# Patient Record
Sex: Female | Born: 1955 | Race: White | Hispanic: No | Marital: Married | Smoking: Former smoker
Health system: Southern US, Community
[De-identification: ages and names within clinical notes are randomized; demographics above are authoritative.]

## PROBLEM LIST (undated history)

## (undated) DIAGNOSIS — D649 Anemia, unspecified: Secondary | ICD-10-CM

## (undated) DIAGNOSIS — I10 Essential (primary) hypertension: Secondary | ICD-10-CM

## (undated) DIAGNOSIS — R42 Dizziness and giddiness: Secondary | ICD-10-CM

## (undated) DIAGNOSIS — I499 Cardiac arrhythmia, unspecified: Secondary | ICD-10-CM

## (undated) DIAGNOSIS — G4733 Obstructive sleep apnea (adult) (pediatric): Secondary | ICD-10-CM

## (undated) DIAGNOSIS — E039 Hypothyroidism, unspecified: Secondary | ICD-10-CM

## (undated) DIAGNOSIS — K5790 Diverticulosis of intestine, part unspecified, without perforation or abscess without bleeding: Secondary | ICD-10-CM

## (undated) DIAGNOSIS — F419 Anxiety disorder, unspecified: Secondary | ICD-10-CM

## (undated) DIAGNOSIS — M1990 Unspecified osteoarthritis, unspecified site: Secondary | ICD-10-CM

## (undated) DIAGNOSIS — E669 Obesity, unspecified: Secondary | ICD-10-CM

## (undated) DIAGNOSIS — E785 Hyperlipidemia, unspecified: Secondary | ICD-10-CM

## (undated) DIAGNOSIS — K219 Gastro-esophageal reflux disease without esophagitis: Secondary | ICD-10-CM

## (undated) DIAGNOSIS — K635 Polyp of colon: Secondary | ICD-10-CM

## (undated) HISTORY — DX: Hyperlipidemia, unspecified: E78.5

## (undated) HISTORY — PX: OTHER SURGICAL HISTORY: SHX169

## (undated) HISTORY — DX: Polyp of colon: K63.5

## (undated) HISTORY — DX: Dizziness and giddiness: R42

## (undated) HISTORY — DX: Obesity, unspecified: E66.9

## (undated) HISTORY — DX: Cardiac arrhythmia, unspecified: I49.9

## (undated) HISTORY — DX: Diverticulosis of intestine, part unspecified, without perforation or abscess without bleeding: K57.90

## (undated) HISTORY — DX: Obstructive sleep apnea (adult) (pediatric): G47.33

## (undated) HISTORY — DX: Essential (primary) hypertension: I10

---

## 1992-11-03 HISTORY — PX: ABDOMINAL HYSTERECTOMY: SHX81

## 1999-06-11 ENCOUNTER — Other Ambulatory Visit: Admission: RE | Admit: 1999-06-11 | Discharge: 1999-06-11 | Payer: Self-pay | Admitting: Obstetrics and Gynecology

## 1999-12-22 ENCOUNTER — Encounter: Payer: Self-pay | Admitting: Obstetrics and Gynecology

## 1999-12-22 ENCOUNTER — Encounter: Admission: RE | Admit: 1999-12-22 | Discharge: 1999-12-22 | Payer: Self-pay | Admitting: Obstetrics and Gynecology

## 2000-09-18 ENCOUNTER — Other Ambulatory Visit: Admission: RE | Admit: 2000-09-18 | Discharge: 2000-09-18 | Payer: Self-pay | Admitting: Obstetrics and Gynecology

## 2000-12-25 ENCOUNTER — Encounter: Admission: RE | Admit: 2000-12-25 | Discharge: 2000-12-25 | Payer: Self-pay | Admitting: Family Medicine

## 2000-12-25 ENCOUNTER — Encounter: Payer: Self-pay | Admitting: Family Medicine

## 2001-09-23 ENCOUNTER — Other Ambulatory Visit: Admission: RE | Admit: 2001-09-23 | Discharge: 2001-09-23 | Payer: Self-pay | Admitting: Obstetrics and Gynecology

## 2002-01-07 ENCOUNTER — Encounter: Admission: RE | Admit: 2002-01-07 | Discharge: 2002-01-07 | Payer: Self-pay | Admitting: Obstetrics and Gynecology

## 2002-01-07 ENCOUNTER — Encounter: Payer: Self-pay | Admitting: Obstetrics and Gynecology

## 2003-01-12 ENCOUNTER — Encounter: Payer: Self-pay | Admitting: Obstetrics and Gynecology

## 2003-01-12 ENCOUNTER — Encounter: Admission: RE | Admit: 2003-01-12 | Discharge: 2003-01-12 | Payer: Self-pay | Admitting: Obstetrics and Gynecology

## 2003-03-03 ENCOUNTER — Ambulatory Visit: Admission: RE | Admit: 2003-03-03 | Discharge: 2003-03-03 | Payer: Self-pay

## 2004-01-25 ENCOUNTER — Encounter: Admission: RE | Admit: 2004-01-25 | Discharge: 2004-01-25 | Payer: Self-pay | Admitting: Family Medicine

## 2004-09-26 ENCOUNTER — Ambulatory Visit (HOSPITAL_COMMUNITY): Admission: RE | Admit: 2004-09-26 | Discharge: 2004-09-26 | Payer: Self-pay | Admitting: Gastroenterology

## 2005-02-14 ENCOUNTER — Encounter: Admission: RE | Admit: 2005-02-14 | Discharge: 2005-02-14 | Payer: Self-pay | Admitting: Family Medicine

## 2006-02-15 ENCOUNTER — Encounter: Admission: RE | Admit: 2006-02-15 | Discharge: 2006-02-15 | Payer: Self-pay | Admitting: Internal Medicine

## 2006-04-11 ENCOUNTER — Observation Stay (HOSPITAL_COMMUNITY): Admission: EM | Admit: 2006-04-11 | Discharge: 2006-04-13 | Payer: Self-pay | Admitting: Emergency Medicine

## 2006-04-13 ENCOUNTER — Encounter (INDEPENDENT_AMBULATORY_CARE_PROVIDER_SITE_OTHER): Payer: Self-pay | Admitting: Specialist

## 2007-02-18 ENCOUNTER — Encounter: Admission: RE | Admit: 2007-02-18 | Discharge: 2007-02-18 | Payer: Self-pay | Admitting: Obstetrics and Gynecology

## 2007-12-09 ENCOUNTER — Encounter: Payer: Self-pay | Admitting: Pulmonary Disease

## 2008-02-20 ENCOUNTER — Encounter: Admission: RE | Admit: 2008-02-20 | Discharge: 2008-02-20 | Payer: Self-pay | Admitting: Internal Medicine

## 2009-02-26 ENCOUNTER — Encounter: Admission: RE | Admit: 2009-02-26 | Discharge: 2009-02-26 | Payer: Self-pay | Admitting: Internal Medicine

## 2009-06-06 ENCOUNTER — Encounter: Payer: Self-pay | Admitting: Pulmonary Disease

## 2009-06-14 ENCOUNTER — Encounter (INDEPENDENT_AMBULATORY_CARE_PROVIDER_SITE_OTHER): Payer: Self-pay | Admitting: *Deleted

## 2009-06-15 ENCOUNTER — Ambulatory Visit: Payer: Self-pay | Admitting: Pulmonary Disease

## 2009-06-15 DIAGNOSIS — E785 Hyperlipidemia, unspecified: Secondary | ICD-10-CM | POA: Insufficient documentation

## 2009-06-15 DIAGNOSIS — I10 Essential (primary) hypertension: Secondary | ICD-10-CM | POA: Insufficient documentation

## 2009-06-15 DIAGNOSIS — G4730 Sleep apnea, unspecified: Secondary | ICD-10-CM | POA: Insufficient documentation

## 2009-08-29 ENCOUNTER — Encounter: Payer: Self-pay | Admitting: Pulmonary Disease

## 2009-10-11 ENCOUNTER — Ambulatory Visit: Payer: Self-pay | Admitting: Pulmonary Disease

## 2009-10-26 ENCOUNTER — Telehealth: Payer: Self-pay | Admitting: Pulmonary Disease

## 2010-02-28 ENCOUNTER — Encounter: Admission: RE | Admit: 2010-02-28 | Discharge: 2010-02-28 | Payer: Self-pay | Admitting: Internal Medicine

## 2010-06-21 ENCOUNTER — Encounter: Payer: Self-pay | Admitting: Pulmonary Disease

## 2010-06-21 ENCOUNTER — Telehealth (INDEPENDENT_AMBULATORY_CARE_PROVIDER_SITE_OTHER): Payer: Self-pay | Admitting: *Deleted

## 2010-09-19 ENCOUNTER — Ambulatory Visit: Payer: Self-pay | Admitting: Pulmonary Disease

## 2010-09-20 DIAGNOSIS — J019 Acute sinusitis, unspecified: Secondary | ICD-10-CM | POA: Insufficient documentation

## 2010-12-06 NOTE — Assessment & Plan Note (Signed)
Summary: rov/ mbw   Visit Type:  Follow-up Copy to:  dr Felipa Eth Primary Provider/Referring Provider:  dr. Felipa Eth  CC:  Sara Evans here for yearly follow up. Sara Evans states is sleeping better w/o CPAP and wants to discuss coming off since weight loss.  History of Present Illness: 55/F, referred for management of obstructive sleep apnea. Overnight PSG in 11/08 showing an AHI of 35/h (Dr Dohmeier) Her BMI then was 47, wt 264, Epworth Sleepiness Score 12. CPAP titration performed in 2/09, required 10 cm , nasal mask, good control of events in REM & supine sleep. Download from 2/1 to 06/06/09 >> residual AHI 14.7, predom hypopneas, good compliance , no leak. download 8/25- 08/29/10 >> good compliance, residual Ahi 10, occ leak.  September 19, 2010 9:19 AM  struggling with CPAP, wondering if she can come off  aug '11 >>Sara Evans has lost 30 lbs since April 1.  feels like cpap pressure is too high, wake up and ,mouth is too dry.  Has to take it off in the middle of the night. downlaod reviewed 5/18- 06/21/10 on 12 cm >> good usage, leak ++, residual AHI 14.6/h -Dryness may be due to leak or mouth breathing  Also c/o cough- yellow phlegm followig URI & nasal discharge    Preventive Screening-Counseling & Management  Alcohol-Tobacco     Smoking Status: quit     Packs/Day: 0.5     Year Started: 1973     Year Quit: 1977  Current Medications (verified): 1)  Toprol Xl 100 Mg Xr24h-Tab (Metoprolol Succinate) .... Take 1 Tablet By Mouth Once A Day 2)  Lipitor 10 Mg Tabs (Atorvastatin Calcium) .... Take 1 Tablet By Mouth Once A Day 3)  Ranitidine Hcl 150 Mg Tabs (Ranitidine Hcl) .... Take 1 Tablet By Mouth Two Times A Day 4)  Caltrate 600+d 600-400 Mg-Unit Tabs (Calcium Carbonate-Vitamin D) .... Take 1 Tablet By Mouth Two Times A Day 5)  Stool Softener 100 Mg Caps (Docusate Sodium) .... Take 1 Tab By Mouth At Bedtime 6)  Multivitamins   Tabs (Multiple Vitamin) .... Take 1 Tablet By Mouth Once A Day 7)  Aleve 220 Mg Tabs  (Naproxen Sodium) .... As Needed 8)  Xanax 0.5 Mg Tabs (Alprazolam) .... As Needed 9)  Fosamax 70 Mg Tabs (Alendronate Sodium) .... Take 1 Tablet By Mouth Once A Week 10)  Aspirin 81 Mg  Tabs (Aspirin) .... Take 1 Tablet By Mouth Once A Day 11)  Tribenzor 40-10-25 Mg Tabs (Olmesartan-Amlodipine-Hctz) .... Take 1/2 Tablet By Mouth Once A Day 12)  Lasix 20 Mg Tabs (Furosemide) .... As Needed 13)  Cpap .Marland Kitchen.. 12  Allergies (verified): 1)  ! Sulfa 2)  ! Maxzide 3)  ! Wellbutrin  Past History:  Past Medical History: Last updated: 06/15/2009 Hyperlipidemia Hypertension Sleep Apnea  Social History: Last updated: 06/15/2009 Patient states former smoker.  1977  Review of Systems       The patient complains of weight loss.  The patient denies anorexia, fever, weight gain, vision loss, decreased hearing, hoarseness, chest pain, syncope, dyspnea on exertion, peripheral edema, prolonged cough, headaches, hemoptysis, abdominal pain, melena, hematochezia, severe indigestion/heartburn, hematuria, muscle weakness, suspicious skin lesions, transient blindness, difficulty walking, depression, unusual weight change, abnormal bleeding, enlarged lymph nodes, and angioedema.    Vital Signs:  Patient profile:   55 year old female Height:      63 inches Weight:      227 pounds BMI:     40.36 O2 Sat:  100 % on Room air Temp:     98.0 degrees F oral Pulse rate:   63 / minute BP sitting:   142 / 94  (left arm) Cuff size:   large  Vitals Entered By: Zackery Barefoot CMA (September 19, 2010 9:00 AM)  O2 Flow:  Room air CC: Sara Evans here for yearly follow up. Sara Evans states is sleeping better w/o CPAP and wants to discuss coming off since weight loss Comments Medications reviewed with patient Verified contact number and pharmacy with patient Zackery Barefoot CMA  September 19, 2010 9:00 AM    Physical Exam  Additional Exam:  wt 227 September 20, 2010  Gen. Pleasant, well-nourished, in no distress, normal  affect ENT - no lesions, no post nasal drip, class 3 airway Neck: No JVD, no thyromegaly, no carotid bruits Lungs: no use of accessory muscles, no dullness to percussion, clear without rales or rhonchi  Cardiovascular: Rhythm regular, heart sounds  normal, no murmurs or gallops, no peripheral edema Musculoskeletal: No deformities, no cyanosis or clubbing      Impression & Recommendations:  Problem # 1:  SLEEP APNEA (ICD-780.57) Suggest home sleep study since she has lost significant (>10%) of body weight. She would like to wait some more time  as she is hopeful of losing more. She seems to be truly strugling with CPAP. I discussed oral appliance as an alternative - she would rather hold off for now. Orders: Est. Patient Level III (16109) Sleep Disorder Referral (Sleep Disorder)  Problem # 2:  SINUSITIS, ACUTE (ICD-461.9)  Her updated medication list for this problem includes:    Azithromycin 500 Mg Tabs (Azithromycin) ..... Once daily  Medications Added to Medication List This Visit: 1)  Tribenzor 40-10-25 Mg Tabs (Olmesartan-amlodipine-hctz) .... Take 1/2 tablet by mouth once a day 2)  Azithromycin 500 Mg Tabs (Azithromycin) .... Once daily  Patient Instructions: 1)  Copy sent to: [facility] 2)  Please schedule a follow-up appointment in 4 months. 3)  Home study  Prescriptions: AZITHROMYCIN 500 MG TABS (AZITHROMYCIN) once daily  #5 x 0   Entered and Authorized by:   Comer Locket Vassie Loll MD   Signed by:   Comer Locket Vassie Loll MD on 09/19/2010   Method used:   Electronically to        [facility] [address] [id number] ([facility]ail)       [address]       [place], [place]  [address]       Ph: [phone]       Fax: [phone]   RxID:   [id number]    Immunization History:  Influenza Immunization History:    Influenza:  historical (07/27/2010)

## 2010-12-06 NOTE — Progress Notes (Signed)
Summary: cpap problem  Phone Note Call from Patient Call back at Home Phone [phone] Call back at cell [phone]   Caller: Patient Call For: Sara Evans Reason for Call: Talk to Nurse, Talk to Doctor Summary of Call: pt has lost 30 lbs since April 1.  feels like cpap pressure is too high, wake up and ,mouth is too dry.  Has to take it off in the middle of the night. Please advise. Initial call taken by: Eugene Gavia,  June 21, 2010 11:09 AM  Follow-up for Phone Call        called and spoke with pt.  pt states she has lost 33 lbs since April and wonders if pressure may need to be adjusted on cpap.   Pt states she has noticed that she wakes up several times during the night with dry mouth dispite using humidifer on cpap machine.  Pt states she switched masks to nasal pillows and states she likes the nasal pillows more than the mask- more comfortable. but states nasal pillow do "make a lot of noise" unless pt wears it very tight which pt states is uncomfortable.  pt does c/o waking up with gas in the morning but denies headaches.  will forward message to RA to address. Arman Filter LPN  June 21, 2010 11:37 AM   Additional Follow-up for Phone Call Additional follow up Details #1::        obtain download on 12 cm  Additional Follow-up by: Comer Locket. Sara Loll MD,  June 21, 2010 2:17 PM    Additional Follow-up for Phone Call Additional follow up Details #2::    called and spoke with pt.  pt aware order sent to [facility] to get download off of cpap machine.  Aundra Millet Reynolds LPN  June 21, 2010 2:25 PM    Appended Document: cpap problem dwonlaod 5/18- 06/21/10 on 12 cm >> good usage, leak ++, resiaual AHI 14.6/h Let her know  -Dryness may be due to leak or mouth breathing - OV to discuss download pl  Appended Document: cpap problem LMOMTCB x1. Need to schedule pt with RA for follow up to discuss download. jwr  Appended Document: cpap problem jwr  Appended Document: cpap problem LMOMTCB x 2.  pt has appointment scheduled 07-29-10 @ 9:15am. Need to inform pt of dryness on 07-04-10 append. jwr  Appended Document: cpap problem pt advised.

## 2010-12-19 ENCOUNTER — Telehealth: Payer: Self-pay | Admitting: Pulmonary Disease

## 2011-01-16 ENCOUNTER — Other Ambulatory Visit: Payer: Self-pay | Admitting: Internal Medicine

## 2011-01-16 DIAGNOSIS — Z1231 Encounter for screening mammogram for malignant neoplasm of breast: Secondary | ICD-10-CM

## 2011-01-17 NOTE — Progress Notes (Signed)
Summary: nasal congestion/apnea link  Phone Note Call from Patient   Caller: Patient Call For: Dr. Vassie Loll Reason for Call: Referral Summary of Call: [phone] had contacted pt several times about arranging the apnea link for her. Each time she stated that she was having problems with allergies and stuffy nose.  Pt called today and stated that she had seen her primary doctor, Dr. Felipa Eth, and that he had place her on an allergy pill and a nasal spray. She states that this has helped some, however, she is still having some issues with a stuffy nose at night. She wanted to know if you wanted her to go ahead and do the apnea link or wait until March when hopefully she will have less problems? Please advise.  Initial call taken by: Alfonso Ramus,  December 19, 2010 9:58 AM  Follow-up for Phone Call        OK to wait until she is better. She can call us back when ready. Until then , I would suggest she stay on CPAP Patient phoned stated that she was returning a call to [place]. She can be reached at either [phone] cell [phone].Vedia Coffer  December 20, 2010 10:54 AM  Follow-up by: Comer Locket. Vassie Loll MD,  December 19, 2010 2:27 PM  Additional Follow-up for Phone Call Additional follow up Details #1::        LM for pt to return my call. Rhonda Cobb  December 19, 2010 4:35 PM [facility] for pt to return my call. Rhonda Cobb  December 20, 2010 3:20 PM Pt stated that she will call me back when she wants to schedule the apnea link. Rhonda Cobb  January 13, 2011 9:28 AM

## 2011-03-03 ENCOUNTER — Ambulatory Visit
Admission: RE | Admit: 2011-03-03 | Discharge: 2011-03-03 | Disposition: A | Discharge status: Home / Self Care | Payer: 59 | Source: Ambulatory Visit | Attending: Internal Medicine | Admitting: Internal Medicine

## 2011-03-03 DIAGNOSIS — Z1231 Encounter for screening mammogram for malignant neoplasm of breast: Secondary | ICD-10-CM

## 2011-03-24 NOTE — Op Note (Signed)
NAME:  Sara Evans, Sara Evans                ACCOUNT NO.:  [id number]   MEDICAL RECORD NO.:  [id number]          PATIENT TYPE:  AMB   LOCATION:  ENDO                         FACILITY:  [facility]   PHYSICIAN:  Danise Edge, M.D.   DATE OF BIRTH:  08-17-1956   DATE OF PROCEDURE:  09/26/2004  DATE OF DISCHARGE:                                 OPERATIVE REPORT   INDICATIONS:  Ms. Sara Evans is a 55 year old female born Nov 17, 1955.  [name] underwent a colonoscopy 10 years ago which was normal. She is  scheduled to undergo a screening colonoscopy with polypectomy to prevent  colon cancer.   ENDOSCOPIST:  Danise Edge, M.D.   PREMEDICATION:  Versed 6 mg, Demerol 60 mg.   PROCEDURE NOTE:  After obtaining informed consent, Ms. Sara Evans was placed in  the left lateral decubitus position. I administered intravenous Demerol and  intravenous Versed to achieve conscious sedation for the procedure. The  patient's blood pressure, oxygen saturation, and cardiac rhythm were  monitored throughout the procedure and documented in the medical record.   Anal inspection and digital rectal exam were normal. The Olympus adjustable  pediatric colonoscopy was introduced into the rectum and advanced to the  cecum. Colonic preparation for exam today was excellent.   Rectum:  Normal.   Sigmoid colon and descending colon:  Normal.   Splenic flexure:  Normal.   Transverse colon:  Normal.   Hepatic flexure:  Normal.   Ascending colon:  Normal.   Cecum and ileocecal valve:  Normal.   ASSESSMENT:  Normal screening proctocolonoscopy to the cecum.      MJ/MEDQ  D:  09/26/2004  T:  09/26/2004  Job:  [id number]   cc:   Bryan Lemma. Manus Gunning, M.D.  [address] [place]  [place] [address]  Fax: [phone]

## 2011-03-24 NOTE — Discharge Summary (Signed)
NAME:  Sara Evans, Sara Evans                ACCOUNT NO.:  [id number]   MEDICAL RECORD NO.:  [id number]          PATIENT TYPE:  INP   LOCATION:  1428                         FACILITY:  [facility]   PHYSICIAN:  Jonna L. Robb Matar, M.D.DATE OF BIRTH:  1956/05/29   DATE OF ADMISSION:  04/11/2006  DATE OF DISCHARGE:  04/13/2006                                 DISCHARGE SUMMARY   CODE STATUS:  Full code.   ALLERGIES:  WELLBUTRIN, SULFUR AND MAXZIDE.   PRIMARY CARE PHYSICIAN:  Lovenia Kim, D.O.   GASTROENTEROLOGIST:  Danise Edge, M.D.   DISCHARGE DIAGNOSES:  1.  Nonspecific colitis and diarrhea.  2.  Hypokalemia.  3.  Hypertension.  4.  Osteoarthritis.  5.  Urinary tract infection.  6.  Hyperlipidemia.  7.  Depression.   HISTORY OF PRESENT ILLNESS:  This is a 55 year old Caucasian female with a  three-week history of severe diarrhea and weakness, who was found to have  hypokalemia in the emergency room.   PHYSICAL EXAMINATION:  ABDOMEN:  Is unremarkable other than a slightly  tender abdomen with normal bowel sounds.  MUSCULOSKELETAL:  Osteoarthritic changes.   LABORATORY DATA:  White count was 17.3, potassium 2.9.   HOSPITAL COURSE:  The patient was re-hydrated and her potassium was  replaced.  She was seen by Dr. Danise Edge, who did a flexible  sigmoidoscopy and found nonspecific colitis.  The patient had a CT of the  abdomen showing small right lung nodules, a hiatal hernia and diffuse  __________ wall thickening.  The patient's PPD was negative.  She had been  exposed to TB.   DISPOSITION:  The patient will be discharged on a bland diet with lots of  extra fluids.   DISCHARGE MEDICATIONS:  1.  She will take Pepto Bismol chewable tablets, 3 after each meal until her      diarrhea clears.  2.  Lomotil 2 mg, one or two with meals and at bedtime p.r.n.  3.  Potassium 20 b.i.d. until her diarrhea clears.  4.  She will continue her regular medications of Toprol 100 mg daily.  5.  Cozaar 100 mg daily.  6.  Lipitor 10 mg daily.  7.  Lexapro 20 mg daily.  8.  Zantac 75 mg b.i.d.  9.  She will restart her Caltrate when she is better.   FOLLOWUP:  1.  She will return to see Dr. Danise Edge in one week.  2.  She should have a follow-up CT scan of her chest in six months.      Jonna L. Robb Matar, M.D.  Electronically Signed     JLB/MEDQ  D:  04/13/2006  T:  04/14/2006  Job:  [id number]   cc:   Lovenia Kim, D.O.  Fax: [phone]

## 2011-03-24 NOTE — H&P (Signed)
NAME:  Sara Evans, Sara Evans                ACCOUNT NO.:  [id number]   MEDICAL RECORD NO.:  [id number]          PATIENT TYPE:  INP   LOCATION:  1428                         FACILITY:  [facility]   PHYSICIAN:  Jonna L. Robb Matar, M.D.DATE OF BIRTH:  [birth year]   DATE OF ADMISSION:  04/11/2006  DATE OF DISCHARGE:                                HISTORY & PHYSICAL   PRIMARY CARE PHYSICIAN:  Lovenia Kim, D.O.   ALLERGIES:  SULFA, HYDROCHLOROTHIAZIDE, WELLBUTRIN.   CODE STATUS:  Full.   CHIEF COMPLAINT:  Diarrhea and weakness.   HISTORY OF PRESENT ILLNESS:  This 55 year old white female has underlying  background of irritable bowel syndrome in the past and has needed on  occasion to take fiber, stool softeners.  Most recent episode of that was  last summer.  About 3 weeks ago, however, she started to develop severe  diarrhea.  She had that for several days and then she had one day where she  was actually vomiting yellow material, nonbloody and then she went to see  Dr. Elisabeth Most who noted she had an elevated white count.  She was found to  have a urinary tract infection and took Cipro for 3 days and for a couple of  days she felt better. She gave a stool sample on Apr 04, 2006 and she was  told that was fine.  She had an ultrasound done and was told her gallbladder  was okay.  However, she continued to have diarrhea and weakness and actually  made an appointment to see Dr. Letitia Libra on Friday but by last night she was  as sick as she has been; she was having perfuse diarrhea and weakness and  finally gave up at 2 o'clock in the morning and came into the hospital where  she was found to have a very low potassium.   PAST MEDICAL HISTORY:  Gastroesophageal reflux disease, osteoarthritis,  hypertension, hypercholesterolemia.   PAST SURGICAL HISTORY:  Hysterectomy and laparoscopy for endometriosis.   FAMILY HISTORY:  Father died of throat cancer, also had hypertension. Mother  died of chronic  renal failure and also had dementia and hypertension.  She  has one hypertensive brother.   MEDICATIONS:  1.  Toprol 100 mg daily.  2.  Cozaar 100 mg daily.  3.  Lipitor 10 mg daily.  4.  Lexapro 20 mg daily.  5.  Caltrate 500 mg b.i.d.  6.  Zantac 75 mg b.i.d.  7.  Naproxen occasionally over the counter.   SOCIAL HISTORY:  Nonsmoker, nondrinker, no drugs.  She is married. She is a  housewife. She has a son in his 30's who has MS.  She has a daughter who  died at 66 months old from seizures that she felt were brought on right after  having gotten some vaccinations.   REVIEW OF SYSTEMS:  12 systems reviewed.  Patient has occasional headaches.  She has been told occasionally of an irregular heart beat.  She was told of  anemia at one time.  She has arthritis mostly in her knees.  She has not  been told  of heart disease, previous colitis, no hematemesis or melena.  Other review of systems were negative with one exception; when I discussed  these little nodules in her lung and said we would check for tuberculosis  she said she had a good friend in her 57's who is being treated for  tuberculosis.   PHYSICAL EXAMINATION:  VITAL SIGNS:  Stable.  GENERAL APPEARANCE:  The patient is a well-developed, overweight, Caucasian  female.  HEENT:  Conjunctiva and lids are normal.  Pupils are reactive.  Extraocular  movements are full.  There is normal appearing mucosa and pharynx.  NECK:  The neck shows no mass, thyromegaly or carotid bruits.  RESPIRATORY:  Effort is normal.  The lungs are clear to auscultation and  percussion without wheezing, rales, rhonchi or dullness.  HEART:  Normal S1, S2 without murmurs, rubs or gallops.  There is a regular  rate and rhythm.  No bruits.  EXTREMITIES:  No cyanosis, clubbing or edema.  BREASTS:  Show no masses, tenderness or discharge.  ABDOMEN:  Slightly tender.  Bowel sounds are normal.  There is no  hepatosplenomegaly or hernia.  GENITOURINARY:   External genitalia is normal.  MUSCULOSKELETAL:  There is no cervical adenopathy.  Muscle strength is 5/5.  There is full range of motion in all four extremities.  She does have some  osteoarthritic changes.  Skin shows no rashes, lesions or nodules.  NEUROLOGICAL:  Cranial nerves II-XII are intact.  Deep tendon reflexes are  2+.  Sensation is normal.  Patient is alert and oriented x3.  Normal memory,  judgment and affect.   LABORATORY DATA:  On initial laboratory data the white count is 17.3.  The  potassium is 2.9 up to 3.2 after some replacement.   CLINICAL DATA:  CT scan of abdomen and pelvis shows diffuse bowel wall  thickening consistent with infectious or inflammatory colitis, small  bilateral lower lobe pulmonary nodules.  Small periumbilical hernia and  hepatic cysts.   IMPRESSION:  1.  Colitis.  We need to make sure she has not been exposed to Clostridium      difficile.  Also check for other food poisoning and I will ask Dr.      Danise Edge on consult.  2.  Hypokalemia.  This will be replaced.  3.  Hyperlipidemia.  4.  Hypercholesterolemia.  5.  Depression.  6.  Osteoarthritis.      Jonna L. Robb Matar, M.D.  Electronically Signed     JLB/MEDQ  D:  04/11/2006  T:  04/11/2006  Job:  [id number]   cc:   Lovenia Kim, D.O.  Fax: [phone]

## 2011-03-24 NOTE — Op Note (Signed)
NAME:  Sara Evans, Sara Evans                ACCOUNT NO.:  [id number]   MEDICAL RECORD NO.:  [id number]          PATIENT TYPE:  INP   LOCATION:  1428                         [facility]ACILITY[facility]  [facility]   PHYSICIAN:  Danise Edge, M.D.   DATE OF BIRTH:  Oct 08, 1956   DATE OF PROCEDURE:  04/13/2006  DATE OF DISCHARGE:                                 OPERATIVE REPORT   PROCEDURE:  Flexible proctosigmoidoscopy with rectal biopsy.   REFERRING PHYSICIAN:  Lovenia Kim, D.O.   PROCEDURE INDICATIONS:  Sara Evans is a 55 year old female born 1956-09-28.  Sara Evans was admitted to [facility] through the  emergency room on April 11, 2006, to evaluate and treat a three week history  of non-bloody diarrhea associated with light headedness and weakness but  unassociated with vomiting or significant abdominal discomfort.  Sara Evans  completed a course of Ciprofloxacin approximately 1 1/2 weeks ago.  Stool  culture and stool C. difficile toxin screen in the hospital have been  negative.  On September 26, 2004, she underwent a completely normal screening  proctocolonoscopy to the cecum.  On admission to the hospital, she underwent  a CT scan of the abdomen and pelvis which revealed a right lung nodule, left  lung nodule, hiatal hernia, and diffuse colonic wall thickening.   ALLERGIES:  MAXZIDE, SULFA, WELLBUTRIN.   CHRONIC MEDICATIONS:  Cozaar, Lipitor, Xanax, Caltrate, Lexapro, ranitidine,  docusate, naproxen, multivitamin, Toprol XL   PAST MEDICAL HISTORY:  Normal screening proctocolonoscopy to the cecum  September 26, 2004, hypertension, gastroesophageal reflux disease with hiatal  hernia, chronic anxiety, hyperlipidemia, remote hysterectomy.   HABITS:  Sara Evans does not smoke cigarettes or consume alcohol.   ENDOSCOPIST:  Danise Edge, M.D.   PREMEDICATION:  Versed 4 mg, fentanyl 50 mcg.   PROCEDURE:  After obtaining informed consent, Sara Evans was placed in the  left lateral  decubitus position.  I administered intravenous fentanyl and  intravenous Versed to achieve conscious sedation for the procedure.  The  patient's blood pressure, oxygen saturation and cardiac rhythm were  monitored throughout the procedure and documented in the medical record.   Anal inspection and digital rectal exams were normal.  The Olympus  adjustable pediatric colonoscope was introduced into the rectum and advanced  to approximately 70 cm from the anal verge.  Sara Evans was not given a  colonic prep for the exam today.   The endoscopic appearance of the rectum reveals patchy mucosal erythema with  patchy loss of the mucosal vascular pattern but no ulcers or  pseudomembranes.  Rectal biopsies were performed.  The endoscopic appearance  of the left colon with the endoscope advanced to 70 cm from the anal verge  reveals a normal colonic mucosa except for patchy erythema.  There was no  mucosal friability, ulcers, or pseudomembrane formation.   ASSESSMENT:  Essentially normal proctocolonoscopy to 70 cm except for some  mild mucosal erythema of the rectum.  No signs of C. difficile colitis or  bowel disease.  At most, Sara Evans may have very mild nonspecific  proctitis.  ______________________________  Danise Edge, M.D.     MJ/MEDQ  D:  04/13/2006  T:  04/13/2006  Job:  [id number]   cc:   Lovenia Kim, D.O.  Fax: [phone]

## 2011-11-01 ENCOUNTER — Other Ambulatory Visit: Payer: Self-pay | Admitting: Dermatology

## 2012-01-23 ENCOUNTER — Other Ambulatory Visit: Payer: Self-pay | Admitting: Internal Medicine

## 2012-01-23 DIAGNOSIS — Z1231 Encounter for screening mammogram for malignant neoplasm of breast: Secondary | ICD-10-CM

## 2012-03-04 ENCOUNTER — Ambulatory Visit
Admission: RE | Admit: 2012-03-04 | Discharge: 2012-03-04 | Disposition: A | Discharge status: Home / Self Care | Payer: 59 | Source: Ambulatory Visit | Attending: Internal Medicine | Admitting: Internal Medicine

## 2012-03-04 DIAGNOSIS — Z1231 Encounter for screening mammogram for malignant neoplasm of breast: Secondary | ICD-10-CM

## 2013-01-23 ENCOUNTER — Other Ambulatory Visit: Payer: Self-pay

## 2013-01-23 DIAGNOSIS — Z1231 Encounter for screening mammogram for malignant neoplasm of breast: Secondary | ICD-10-CM

## 2013-02-06 ENCOUNTER — Other Ambulatory Visit: Payer: Self-pay | Admitting: Dermatology

## 2013-03-03 ENCOUNTER — Other Ambulatory Visit: Payer: Self-pay | Admitting: Dermatology

## 2013-03-05 ENCOUNTER — Ambulatory Visit
Admission: RE | Admit: 2013-03-05 | Discharge: 2013-03-05 | Disposition: A | Discharge status: Home / Self Care | Payer: 59 | Source: Ambulatory Visit

## 2013-03-05 DIAGNOSIS — Z1231 Encounter for screening mammogram for malignant neoplasm of breast: Secondary | ICD-10-CM

## 2014-01-13 ENCOUNTER — Other Ambulatory Visit: Payer: Self-pay

## 2014-01-13 DIAGNOSIS — Z1231 Encounter for screening mammogram for malignant neoplasm of breast: Secondary | ICD-10-CM

## 2014-02-05 ENCOUNTER — Encounter: Payer: Self-pay | Admitting: Pulmonary Disease

## 2014-02-09 ENCOUNTER — Encounter: Payer: Self-pay | Admitting: Pulmonary Disease

## 2014-02-09 ENCOUNTER — Ambulatory Visit (INDEPENDENT_AMBULATORY_CARE_PROVIDER_SITE_OTHER): Payer: 59 | Admitting: Pulmonary Disease

## 2014-02-09 VITALS — BP 104/60 | HR 58 | Temp 97.9°F | Ht 63.0 in | Wt 257.2 lb

## 2014-02-09 DIAGNOSIS — G4730 Sleep apnea, unspecified: Secondary | ICD-10-CM

## 2014-02-09 NOTE — Progress Notes (Signed)
Subjective:    Patient ID: Sara Evans, female    DOB: 02-27-1956, 58 y.o.   MRN: [id number]  HPI  57/F, referred for management of obstructive sleep apnea.  Overnight PSG in 11/08 showed an AHI of 35/h (Dr Dohmeier) Her BMI then was 47, wt 264, Epworth Sleepiness Score 12. CPAP titration performed in 2/09, required 10 cm , nasal mask, good control of events in REM & supine sleep.  Download from 2/1 to 06/06/09 >> residual AHI 14.7, predom hypopneas, good compliance , no leak.  download 8/25- 08/29/10 >> good compliance, residual Ahi 10, occ leak.    02/09/2014   Chief Complaint  Patient presents with  . Sleep Consult    Re-establish. Last seen 2011. Pt finding it difficult to use CPAP. Epworth-6   She struggled with CPAP and finally gave up usage. She tried all kinds of mask interfaces but could not adjust at all. Her nose gets congested and she takes the mask off every night she has tried allergy medications and nasal sprays She is here to discuss alternatives to CPAP. She lost some weight but has regained to 257 pounds. Bedtime is around 9 PM, she sleeps on her side with 2 pillows, sleep latency is minimal, she is 3-4 nocturnal awakenings which he attributes to stress and is out of bed at 5:30 AM feeling tired. She naps for one to 2 hours every evening. Epworth sleepiness score is 7/24   Past Medical History  Diagnosis Date  . Hyperlipidemia   . OSA (obstructive sleep apnea)   . Hypertension   . Arrhythmia     Past Surgical History  Procedure Laterality Date  . Abdominal hysterectomy      1993    Allergies  Allergen Reactions  . Bupropion Hcl     REACTION: itching  . Hydrochlorothiazide W-Triamterene     REACTION: GI upset  . Sulfonamide Derivatives     REACTION: rash   History   Social History  . Marital Status: Married    Spouse Name: N/A    Number of Children: N/A  . Years of Education: N/A   Occupational History  . housewife    Social History Main  Topics  . Smoking status: Former Smoker -- 0.50 packs/day for 2 years    Types: Cigarettes    Quit date: 11/07/1975  . Smokeless tobacco: Never Used  . Alcohol Use: No  . Drug Use: No  . Sexual Activity: Not on file   Other Topics Concern  . Not on file   Social History Narrative  . No narrative on file    Family History  Problem Relation Age of Onset  . Heart disease Mother   . Lung cancer Father      Review of Systems  Constitutional: Negative for fever and unexpected weight change.  HENT: Positive for nosebleeds and postnasal drip. Negative for congestion, dental problem, ear pain, rhinorrhea, sinus pressure, sneezing, sore throat and trouble swallowing.   Eyes: Negative for redness and itching.  Respiratory: Negative for cough, chest tightness, shortness of breath and wheezing.   Cardiovascular: Negative for palpitations and leg swelling.  Gastrointestinal: Negative for nausea and vomiting.  Genitourinary: Negative for dysuria.  Musculoskeletal: Positive for joint swelling.  Skin: Negative for rash.  Neurological: Negative for headaches.  Hematological: Does not bruise/bleed easily.  Psychiatric/Behavioral: Negative for dysphoric mood. The patient is not nervous/anxious.        Objective:   Physical Exam  Gen. Pleasant, obese,  in no distress, normal affect ENT - no lesions, no post nasal drip, class 2-3 airway Neck: No JVD, no thyromegaly, no carotid bruits Lungs: no use of accessory muscles, no dullness to percussion, decreased without rales or rhonchi  Cardiovascular: Rhythm regular, heart sounds  normal, no murmurs or gallops, no peripheral edema Abdomen: soft and non-tender, no hepatosplenomegaly, BS normal. Musculoskeletal: No deformities, no cyanosis or clubbing Neuro:  alert, non focal, no tremors       Assessment & Plan:

## 2014-02-09 NOTE — Patient Instructions (Signed)
We discussed alternatives to CPAP Make appt with Dr Ron Parker for oral appliance for obstructive sleep apnea

## 2014-02-11 NOTE — Assessment & Plan Note (Signed)
Unfortunately she has not been able to talk with CPAP therapy. I do believe that she has tried her best to adjust. We discussed alternatives to CPAP including dental appliance or EZ Pap We discussed limitations of dental appliance to treat her degree of sleep apnea. Clearly weight loss would be beneficial. Make appt with Dr Ron Parker for oral appliance for obstructive sleep apnea . I do not believe that other therapies are ready for prime time yet. Would not advocate surgical options for her.

## 2014-03-09 ENCOUNTER — Ambulatory Visit
Admission: RE | Admit: 2014-03-09 | Discharge: 2014-03-09 | Disposition: A | Discharge status: Home / Self Care | Payer: 59 | Source: Ambulatory Visit

## 2014-03-09 DIAGNOSIS — Z1231 Encounter for screening mammogram for malignant neoplasm of breast: Secondary | ICD-10-CM

## 2014-09-09 ENCOUNTER — Other Ambulatory Visit: Payer: Self-pay | Admitting: Gastroenterology

## 2014-10-13 ENCOUNTER — Encounter (HOSPITAL_COMMUNITY): Payer: Self-pay | Admitting: *Deleted

## 2014-10-19 ENCOUNTER — Other Ambulatory Visit: Payer: Self-pay | Admitting: Gastroenterology

## 2014-11-02 ENCOUNTER — Ambulatory Visit (HOSPITAL_COMMUNITY)
Admission: RE | Admit: 2014-11-02 | Discharge: 2014-11-02 | Disposition: A | Discharge status: Home / Self Care | Payer: 59 | Source: Ambulatory Visit | Attending: Gastroenterology | Admitting: Gastroenterology

## 2014-11-02 ENCOUNTER — Encounter (HOSPITAL_COMMUNITY): Payer: Self-pay

## 2014-11-02 ENCOUNTER — Encounter (HOSPITAL_COMMUNITY)
Admission: RE | Disposition: A | Discharge status: Home / Self Care | Payer: Self-pay | Source: Ambulatory Visit | Attending: Gastroenterology

## 2014-11-02 ENCOUNTER — Ambulatory Visit (HOSPITAL_COMMUNITY): Payer: 59 | Admitting: Anesthesiology

## 2014-11-02 DIAGNOSIS — K5790 Diverticulosis of intestine, part unspecified, without perforation or abscess without bleeding: Secondary | ICD-10-CM | POA: Insufficient documentation

## 2014-11-02 DIAGNOSIS — Z1211 Encounter for screening for malignant neoplasm of colon: Secondary | ICD-10-CM | POA: Diagnosis not present

## 2014-11-02 DIAGNOSIS — I10 Essential (primary) hypertension: Secondary | ICD-10-CM | POA: Diagnosis not present

## 2014-11-02 DIAGNOSIS — E780 Pure hypercholesterolemia: Secondary | ICD-10-CM | POA: Insufficient documentation

## 2014-11-02 DIAGNOSIS — G4733 Obstructive sleep apnea (adult) (pediatric): Secondary | ICD-10-CM | POA: Diagnosis not present

## 2014-11-02 DIAGNOSIS — E039 Hypothyroidism, unspecified: Secondary | ICD-10-CM | POA: Diagnosis not present

## 2014-11-02 DIAGNOSIS — Z6841 Body Mass Index (BMI) 40.0 and over, adult: Secondary | ICD-10-CM | POA: Diagnosis not present

## 2014-11-02 DIAGNOSIS — M1990 Unspecified osteoarthritis, unspecified site: Secondary | ICD-10-CM | POA: Diagnosis not present

## 2014-11-02 DIAGNOSIS — K219 Gastro-esophageal reflux disease without esophagitis: Secondary | ICD-10-CM | POA: Insufficient documentation

## 2014-11-02 HISTORY — PX: COLONOSCOPY WITH PROPOFOL: SHX5780

## 2014-11-02 HISTORY — DX: Gastro-esophageal reflux disease without esophagitis: K21.9

## 2014-11-02 HISTORY — DX: Unspecified osteoarthritis, unspecified site: M19.90

## 2014-11-02 HISTORY — DX: Hypothyroidism, unspecified: E03.9

## 2014-11-02 HISTORY — DX: Anxiety disorder, unspecified: F41.9

## 2014-11-02 HISTORY — DX: Anemia, unspecified: D64.9

## 2014-11-02 SURGERY — COLONOSCOPY WITH PROPOFOL
Anesthesia: Monitor Anesthesia Care

## 2014-11-02 MED ORDER — LACTATED RINGERS IV SOLN
INTRAVENOUS | Status: DC
  Administered 2014-11-02: 1000 mL via INTRAVENOUS

## 2014-11-02 MED ORDER — PROPOFOL 10 MG/ML IV BOLUS
INTRAVENOUS | Status: AC
  Filled 2014-11-02: qty 20

## 2014-11-02 MED ORDER — SODIUM CHLORIDE 0.9 % IV SOLN: INTRAVENOUS | Status: DC

## 2014-11-02 MED ORDER — PROPOFOL INFUSION 10 MG/ML OPTIME
INTRAVENOUS | Status: DC | PRN
  Administered 2014-11-02: 300 ug/kg/min via INTRAVENOUS

## 2014-11-02 SURGICAL SUPPLY — 21 items

## 2014-11-02 NOTE — Op Note (Signed)
Procedure: Screening colonoscopy. Normal screening colonoscopy performed on 09/26/2004  Endoscopist: Earle Gell  Premedication: Propofol administered by anesthesia  Procedure: The patient was placed in the left lateral decubitus position. Anal inspection and digital rectal exam were normal. The Pentax pediatric colonoscope was introduced into the rectum and advanced to the cecum. A normal-appearing appendiceal orifice was identified. A normal-appearing ileocecal valve was identified. Colonic preparation for the exam today was good. Withdrawal time was 8 minutes  Rectum. Normal. Retroflexed view of the distal rectum normal  Sigmoid colon and descending colon. Left colonic diverticulosis  Splenic flexure. Normal  Transverse colon. Normal  Hepatic flexure. Normal  Ascending colon. Normal  Cecum and ileocecal valve. Normal  Assessment: Normal screening colonoscopy  Recommendation: Schedule repeat screening colonoscopy in 10 years

## 2014-11-02 NOTE — Anesthesia Preprocedure Evaluation (Addendum)
Anesthesia Evaluation  Patient identified by MRN, date of birth, ID band Patient awake    Reviewed: Allergy & Precautions, H&P , NPO status , Patient's Chart, lab work & pertinent test results, reviewed documented beta blocker date and time   Airway Mallampati: II  TM Distance: >3 FB Neck ROM: full    Dental no notable dental hx. (+) Teeth Intact, Dental Advisory Given   Pulmonary sleep apnea , former smoker,  breath sounds clear to auscultation  Pulmonary exam normal       Cardiovascular Exercise Tolerance: Good hypertension, Pt. on home beta blockers Rhythm:regular Rate:Normal     Neuro/Psych negative neurological ROS  negative psych ROS   GI/Hepatic negative GI ROS, Neg liver ROS,   Endo/Other  Hypothyroidism Morbid obesity  Renal/GU negative Renal ROS  negative genitourinary   Musculoskeletal   Abdominal (+) + obese,   Peds  Hematology negative hematology ROS (+)   Anesthesia Other Findings   Reproductive/Obstetrics negative OB ROS                           Anesthesia Physical Anesthesia Plan  ASA: III  Anesthesia Plan: MAC   Post-op Pain Management:    Induction:   Airway Management Planned:   Additional Equipment:   Intra-op Plan:   Post-operative Plan:   Informed Consent: I have reviewed the patients History and Physical, chart, labs and discussed the procedure including the risks, benefits and alternatives for the proposed anesthesia with the patient or authorized representative who has indicated his/her understanding and acceptance.   Dental Advisory Given  Plan Discussed with: CRNA and Surgeon  Anesthesia Plan Comments:         Anesthesia Quick Evaluation

## 2014-11-02 NOTE — Anesthesia Postprocedure Evaluation (Signed)
  Anesthesia Post-op Note  Patient: Sara Evans  Procedure(s) Performed: Procedure(s) (LRB): COLONOSCOPY WITH PROPOFOL (N/A)  Patient Location: PACU  Anesthesia Type: MAC  Level of Consciousness: awake and alert   Airway and Oxygen Therapy: Patient Spontanous Breathing  Post-op Pain: mild  Post-op Assessment: Post-op Vital signs reviewed, Patient's Cardiovascular Status Stable, Respiratory Function Stable, Patent Airway and No signs of Nausea or vomiting  Last Vitals:  Filed Vitals:   11/02/14 1250  BP: 152/83  Pulse: 51  Temp:   Resp: 11    Post-op Vital Signs: stable   Complications: No apparent anesthesia complications

## 2014-11-02 NOTE — H&P (Signed)
  Procedure: Screening colonoscopy. Normal screening colonoscopy performed on 09/26/2004  History: The patient is a 58 year old female born 07/31/1956. She is scheduled to undergo a repeat screening colonoscopy with polypectomy to prevent colon cancer. She denies a family history of colon cancer.  Past medical history: Obstructive sleep apnea syndrome. Hypertension. Hypercholesterolemia. Arthritis. Gastroesophageal reflux. Hypothyroidism. Hysterectomy.  Medication allergies: Sulfa. Codeine. Wellbutrin. Hydrochlorothiazide. Triamterene.  Exam: The patient is alert and lying comfortably on the endoscopy stretcher. Abdomen is soft and nontender to palpation. Cardiac exam reveals a regular rhythm. Lungs are clear to auscultation.  Plan: Proceed with screening colonoscopy

## 2014-11-02 NOTE — Discharge Instructions (Signed)

## 2014-11-02 NOTE — Transfer of Care (Signed)
Immediate Anesthesia Transfer of Care Note  Patient: Sara Evans  Procedure(s) Performed: Procedure(s): COLONOSCOPY WITH PROPOFOL (N/A)  Patient Location: PACU and Endoscopy Unit  Anesthesia Type:MAC  Level of Consciousness: awake, sedated and patient cooperative  Airway & Oxygen Therapy: Patient Spontanous Breathing and Patient connected to face mask oxygen  Post-op Assessment: Report given to PACU RN and Post -op Vital signs reviewed and stable  Post vital signs: Reviewed and stable  Complications: No apparent anesthesia complications

## 2014-11-03 ENCOUNTER — Encounter (HOSPITAL_COMMUNITY): Payer: Self-pay | Admitting: Gastroenterology

## 2014-11-10 ENCOUNTER — Telehealth: Payer: Self-pay | Admitting: Pulmonary Disease

## 2014-11-10 NOTE — Telephone Encounter (Signed)
Pt called back. She meant to call Dr Dagmar Hait. Cancelled message. spm

## 2014-11-10 NOTE — Telephone Encounter (Signed)
lmtcb x1 

## 2015-02-01 ENCOUNTER — Other Ambulatory Visit: Payer: Self-pay

## 2015-02-01 DIAGNOSIS — Z1231 Encounter for screening mammogram for malignant neoplasm of breast: Secondary | ICD-10-CM

## 2015-03-16 ENCOUNTER — Ambulatory Visit
Admission: RE | Admit: 2015-03-16 | Discharge: 2015-03-16 | Disposition: A | Discharge status: Home / Self Care | Payer: BLUE CROSS/BLUE SHIELD | Source: Ambulatory Visit

## 2015-03-16 DIAGNOSIS — Z1231 Encounter for screening mammogram for malignant neoplasm of breast: Secondary | ICD-10-CM

## 2016-02-02 ENCOUNTER — Other Ambulatory Visit: Payer: Self-pay

## 2016-02-02 DIAGNOSIS — Z1231 Encounter for screening mammogram for malignant neoplasm of breast: Secondary | ICD-10-CM

## 2016-03-16 ENCOUNTER — Ambulatory Visit
Admission: RE | Admit: 2016-03-16 | Discharge: 2016-03-16 | Disposition: A | Discharge status: Home / Self Care | Payer: BLUE CROSS/BLUE SHIELD | Source: Ambulatory Visit

## 2016-03-16 DIAGNOSIS — Z1231 Encounter for screening mammogram for malignant neoplasm of breast: Secondary | ICD-10-CM

## 2016-04-07 DIAGNOSIS — H5213 Myopia, bilateral: Secondary | ICD-10-CM | POA: Diagnosis not present

## 2016-05-02 DIAGNOSIS — L918 Other hypertrophic disorders of the skin: Secondary | ICD-10-CM | POA: Diagnosis not present

## 2016-05-02 DIAGNOSIS — D225 Melanocytic nevi of trunk: Secondary | ICD-10-CM | POA: Diagnosis not present

## 2016-05-02 DIAGNOSIS — L821 Other seborrheic keratosis: Secondary | ICD-10-CM | POA: Diagnosis not present

## 2016-05-02 DIAGNOSIS — L7211 Pilar cyst: Secondary | ICD-10-CM | POA: Diagnosis not present

## 2016-05-02 DIAGNOSIS — D1801 Hemangioma of skin and subcutaneous tissue: Secondary | ICD-10-CM | POA: Diagnosis not present

## 2016-05-02 DIAGNOSIS — L570 Actinic keratosis: Secondary | ICD-10-CM | POA: Diagnosis not present

## 2016-05-02 DIAGNOSIS — D485 Neoplasm of uncertain behavior of skin: Secondary | ICD-10-CM | POA: Diagnosis not present

## 2016-06-01 DIAGNOSIS — M1711 Unilateral primary osteoarthritis, right knee: Secondary | ICD-10-CM | POA: Diagnosis not present

## 2016-06-01 DIAGNOSIS — M7071 Other bursitis of hip, right hip: Secondary | ICD-10-CM | POA: Diagnosis not present

## 2016-08-23 DIAGNOSIS — I10 Essential (primary) hypertension: Secondary | ICD-10-CM | POA: Diagnosis not present

## 2016-08-23 DIAGNOSIS — R8299 Other abnormal findings in urine: Secondary | ICD-10-CM | POA: Diagnosis not present

## 2016-08-23 DIAGNOSIS — E038 Other specified hypothyroidism: Secondary | ICD-10-CM | POA: Diagnosis not present

## 2016-08-23 DIAGNOSIS — N390 Urinary tract infection, site not specified: Secondary | ICD-10-CM | POA: Diagnosis not present

## 2016-08-23 DIAGNOSIS — E784 Other hyperlipidemia: Secondary | ICD-10-CM | POA: Diagnosis not present

## 2016-08-23 DIAGNOSIS — M810 Age-related osteoporosis without current pathological fracture: Secondary | ICD-10-CM | POA: Diagnosis not present

## 2016-08-30 DIAGNOSIS — Z1389 Encounter for screening for other disorder: Secondary | ICD-10-CM | POA: Diagnosis not present

## 2016-08-30 DIAGNOSIS — Z0000 Encounter for general adult medical examination without abnormal findings: Secondary | ICD-10-CM | POA: Diagnosis not present

## 2016-08-30 DIAGNOSIS — E038 Other specified hypothyroidism: Secondary | ICD-10-CM | POA: Diagnosis not present

## 2016-08-30 DIAGNOSIS — N3281 Overactive bladder: Secondary | ICD-10-CM | POA: Diagnosis not present

## 2016-08-30 DIAGNOSIS — J309 Allergic rhinitis, unspecified: Secondary | ICD-10-CM | POA: Diagnosis not present

## 2016-08-30 DIAGNOSIS — Z23 Encounter for immunization: Secondary | ICD-10-CM | POA: Diagnosis not present

## 2016-09-07 DIAGNOSIS — Z1212 Encounter for screening for malignant neoplasm of rectum: Secondary | ICD-10-CM | POA: Diagnosis not present

## 2016-10-16 DIAGNOSIS — H43811 Vitreous degeneration, right eye: Secondary | ICD-10-CM | POA: Diagnosis not present

## 2016-11-01 DIAGNOSIS — E784 Other hyperlipidemia: Secondary | ICD-10-CM | POA: Diagnosis not present

## 2017-01-17 ENCOUNTER — Other Ambulatory Visit: Payer: Self-pay | Admitting: Internal Medicine

## 2017-01-17 DIAGNOSIS — Z1231 Encounter for screening mammogram for malignant neoplasm of breast: Secondary | ICD-10-CM

## 2017-02-13 DIAGNOSIS — Z23 Encounter for immunization: Secondary | ICD-10-CM | POA: Diagnosis not present

## 2017-03-19 ENCOUNTER — Ambulatory Visit
Admission: RE | Admit: 2017-03-19 | Discharge: 2017-03-19 | Disposition: A | Discharge status: Home / Self Care | Payer: BLUE CROSS/BLUE SHIELD | Source: Ambulatory Visit | Attending: Internal Medicine | Admitting: Internal Medicine

## 2017-03-19 DIAGNOSIS — Z1231 Encounter for screening mammogram for malignant neoplasm of breast: Secondary | ICD-10-CM | POA: Diagnosis not present

## 2017-04-03 DIAGNOSIS — R05 Cough: Secondary | ICD-10-CM | POA: Diagnosis not present

## 2017-04-03 DIAGNOSIS — B349 Viral infection, unspecified: Secondary | ICD-10-CM | POA: Diagnosis not present

## 2017-04-03 DIAGNOSIS — J029 Acute pharyngitis, unspecified: Secondary | ICD-10-CM | POA: Diagnosis not present

## 2017-04-10 DIAGNOSIS — H35031 Hypertensive retinopathy, right eye: Secondary | ICD-10-CM | POA: Diagnosis not present

## 2017-04-10 DIAGNOSIS — H5213 Myopia, bilateral: Secondary | ICD-10-CM | POA: Diagnosis not present

## 2017-07-17 DIAGNOSIS — H3561 Retinal hemorrhage, right eye: Secondary | ICD-10-CM | POA: Diagnosis not present

## 2017-07-18 DIAGNOSIS — L918 Other hypertrophic disorders of the skin: Secondary | ICD-10-CM | POA: Diagnosis not present

## 2017-07-18 DIAGNOSIS — L821 Other seborrheic keratosis: Secondary | ICD-10-CM | POA: Diagnosis not present

## 2017-07-18 DIAGNOSIS — B078 Other viral warts: Secondary | ICD-10-CM | POA: Diagnosis not present

## 2017-07-18 DIAGNOSIS — L7211 Pilar cyst: Secondary | ICD-10-CM | POA: Diagnosis not present

## 2017-08-04 DIAGNOSIS — Z23 Encounter for immunization: Secondary | ICD-10-CM | POA: Diagnosis not present

## 2017-09-11 DIAGNOSIS — M810 Age-related osteoporosis without current pathological fracture: Secondary | ICD-10-CM | POA: Diagnosis not present

## 2017-09-11 DIAGNOSIS — E038 Other specified hypothyroidism: Secondary | ICD-10-CM | POA: Diagnosis not present

## 2017-09-11 DIAGNOSIS — I10 Essential (primary) hypertension: Secondary | ICD-10-CM | POA: Diagnosis not present

## 2017-09-11 DIAGNOSIS — Z0000 Encounter for general adult medical examination without abnormal findings: Secondary | ICD-10-CM | POA: Diagnosis not present

## 2017-09-18 DIAGNOSIS — Z1389 Encounter for screening for other disorder: Secondary | ICD-10-CM | POA: Diagnosis not present

## 2017-09-18 DIAGNOSIS — G4733 Obstructive sleep apnea (adult) (pediatric): Secondary | ICD-10-CM | POA: Diagnosis not present

## 2017-09-18 DIAGNOSIS — K5909 Other constipation: Secondary | ICD-10-CM | POA: Diagnosis not present

## 2017-09-18 DIAGNOSIS — I10 Essential (primary) hypertension: Secondary | ICD-10-CM | POA: Diagnosis not present

## 2017-09-18 DIAGNOSIS — Z0000 Encounter for general adult medical examination without abnormal findings: Secondary | ICD-10-CM | POA: Diagnosis not present

## 2017-09-18 DIAGNOSIS — E7849 Other hyperlipidemia: Secondary | ICD-10-CM | POA: Diagnosis not present

## 2017-09-28 DIAGNOSIS — Z1212 Encounter for screening for malignant neoplasm of rectum: Secondary | ICD-10-CM | POA: Diagnosis not present

## 2017-10-18 DIAGNOSIS — I10 Essential (primary) hypertension: Secondary | ICD-10-CM | POA: Diagnosis not present

## 2017-10-18 DIAGNOSIS — R42 Dizziness and giddiness: Secondary | ICD-10-CM | POA: Diagnosis not present

## 2017-10-18 DIAGNOSIS — G4733 Obstructive sleep apnea (adult) (pediatric): Secondary | ICD-10-CM | POA: Diagnosis not present

## 2017-10-18 DIAGNOSIS — H3500 Unspecified background retinopathy: Secondary | ICD-10-CM | POA: Diagnosis not present

## 2017-10-19 DIAGNOSIS — Z1212 Encounter for screening for malignant neoplasm of rectum: Secondary | ICD-10-CM | POA: Diagnosis not present

## 2017-12-12 DIAGNOSIS — E038 Other specified hypothyroidism: Secondary | ICD-10-CM | POA: Diagnosis not present

## 2017-12-26 ENCOUNTER — Other Ambulatory Visit: Payer: Self-pay | Admitting: Internal Medicine

## 2017-12-26 DIAGNOSIS — Z139 Encounter for screening, unspecified: Secondary | ICD-10-CM

## 2018-01-16 DIAGNOSIS — H3561 Retinal hemorrhage, right eye: Secondary | ICD-10-CM | POA: Diagnosis not present

## 2018-02-18 DIAGNOSIS — E039 Hypothyroidism, unspecified: Secondary | ICD-10-CM | POA: Diagnosis not present

## 2018-03-20 ENCOUNTER — Ambulatory Visit
Admission: RE | Admit: 2018-03-20 | Discharge: 2018-03-20 | Disposition: A | Discharge status: Home / Self Care | Payer: BLUE CROSS/BLUE SHIELD | Source: Ambulatory Visit | Attending: Internal Medicine | Admitting: Internal Medicine

## 2018-03-20 DIAGNOSIS — Z139 Encounter for screening, unspecified: Secondary | ICD-10-CM

## 2018-03-20 DIAGNOSIS — Z1231 Encounter for screening mammogram for malignant neoplasm of breast: Secondary | ICD-10-CM | POA: Diagnosis not present

## 2018-04-11 DIAGNOSIS — H5213 Myopia, bilateral: Secondary | ICD-10-CM | POA: Diagnosis not present

## 2018-07-29 DIAGNOSIS — Z01419 Encounter for gynecological examination (general) (routine) without abnormal findings: Secondary | ICD-10-CM | POA: Diagnosis not present

## 2018-07-29 DIAGNOSIS — Z1389 Encounter for screening for other disorder: Secondary | ICD-10-CM | POA: Diagnosis not present

## 2018-07-29 DIAGNOSIS — Z130 Encounter for screening for diseases of the blood and blood-forming organs and certain disorders involving the immune mechanism: Secondary | ICD-10-CM | POA: Diagnosis not present

## 2018-08-03 DIAGNOSIS — Z23 Encounter for immunization: Secondary | ICD-10-CM | POA: Diagnosis not present

## 2018-08-20 DIAGNOSIS — D1801 Hemangioma of skin and subcutaneous tissue: Secondary | ICD-10-CM | POA: Diagnosis not present

## 2018-08-20 DIAGNOSIS — L821 Other seborrheic keratosis: Secondary | ICD-10-CM | POA: Diagnosis not present

## 2018-08-20 DIAGNOSIS — L7211 Pilar cyst: Secondary | ICD-10-CM | POA: Diagnosis not present

## 2018-08-20 DIAGNOSIS — D2221 Melanocytic nevi of right ear and external auricular canal: Secondary | ICD-10-CM | POA: Diagnosis not present

## 2018-08-20 DIAGNOSIS — D0439 Carcinoma in situ of skin of other parts of face: Secondary | ICD-10-CM | POA: Diagnosis not present

## 2018-10-01 DIAGNOSIS — C44329 Squamous cell carcinoma of skin of other parts of face: Secondary | ICD-10-CM | POA: Diagnosis not present

## 2018-10-07 DIAGNOSIS — Z4802 Encounter for removal of sutures: Secondary | ICD-10-CM | POA: Diagnosis not present

## 2018-10-11 DIAGNOSIS — Z0000 Encounter for general adult medical examination without abnormal findings: Secondary | ICD-10-CM | POA: Diagnosis not present

## 2018-10-11 DIAGNOSIS — E038 Other specified hypothyroidism: Secondary | ICD-10-CM | POA: Diagnosis not present

## 2018-10-11 DIAGNOSIS — I10 Essential (primary) hypertension: Secondary | ICD-10-CM | POA: Diagnosis not present

## 2018-10-11 DIAGNOSIS — R82998 Other abnormal findings in urine: Secondary | ICD-10-CM | POA: Diagnosis not present

## 2018-10-11 DIAGNOSIS — M810 Age-related osteoporosis without current pathological fracture: Secondary | ICD-10-CM | POA: Diagnosis not present

## 2018-10-15 DIAGNOSIS — Z1212 Encounter for screening for malignant neoplasm of rectum: Secondary | ICD-10-CM | POA: Diagnosis not present

## 2018-10-18 DIAGNOSIS — Z1389 Encounter for screening for other disorder: Secondary | ICD-10-CM | POA: Diagnosis not present

## 2018-10-18 DIAGNOSIS — E7849 Other hyperlipidemia: Secondary | ICD-10-CM | POA: Diagnosis not present

## 2018-10-18 DIAGNOSIS — G4733 Obstructive sleep apnea (adult) (pediatric): Secondary | ICD-10-CM | POA: Diagnosis not present

## 2018-10-18 DIAGNOSIS — Z0000 Encounter for general adult medical examination without abnormal findings: Secondary | ICD-10-CM | POA: Diagnosis not present

## 2018-10-18 DIAGNOSIS — I10 Essential (primary) hypertension: Secondary | ICD-10-CM | POA: Diagnosis not present

## 2019-05-15 ENCOUNTER — Other Ambulatory Visit: Payer: Self-pay | Admitting: Internal Medicine

## 2019-05-15 DIAGNOSIS — Z1231 Encounter for screening mammogram for malignant neoplasm of breast: Secondary | ICD-10-CM

## 2019-06-10 DIAGNOSIS — H5213 Myopia, bilateral: Secondary | ICD-10-CM | POA: Diagnosis not present

## 2019-06-10 DIAGNOSIS — H25813 Combined forms of age-related cataract, bilateral: Secondary | ICD-10-CM | POA: Diagnosis not present

## 2019-06-10 DIAGNOSIS — H33309 Unspecified retinal break, unspecified eye: Secondary | ICD-10-CM | POA: Diagnosis not present

## 2019-06-10 DIAGNOSIS — H43813 Vitreous degeneration, bilateral: Secondary | ICD-10-CM | POA: Diagnosis not present

## 2019-06-10 DIAGNOSIS — H52221 Regular astigmatism, right eye: Secondary | ICD-10-CM | POA: Diagnosis not present

## 2019-06-10 DIAGNOSIS — H43393 Other vitreous opacities, bilateral: Secondary | ICD-10-CM | POA: Diagnosis not present

## 2019-06-10 DIAGNOSIS — H524 Presbyopia: Secondary | ICD-10-CM | POA: Diagnosis not present

## 2019-06-13 DIAGNOSIS — Z0100 Encounter for examination of eyes and vision without abnormal findings: Secondary | ICD-10-CM | POA: Diagnosis not present

## 2019-06-30 ENCOUNTER — Ambulatory Visit
Admission: RE | Admit: 2019-06-30 | Discharge: 2019-06-30 | Disposition: A | Discharge status: Home / Self Care | Payer: Medicare HMO | Source: Ambulatory Visit | Attending: Internal Medicine | Admitting: Internal Medicine

## 2019-06-30 ENCOUNTER — Other Ambulatory Visit: Payer: Self-pay

## 2019-06-30 DIAGNOSIS — Z1231 Encounter for screening mammogram for malignant neoplasm of breast: Secondary | ICD-10-CM

## 2019-07-24 DIAGNOSIS — Z23 Encounter for immunization: Secondary | ICD-10-CM | POA: Diagnosis not present

## 2019-10-28 DIAGNOSIS — L720 Epidermal cyst: Secondary | ICD-10-CM | POA: Diagnosis not present

## 2019-10-28 DIAGNOSIS — D225 Melanocytic nevi of trunk: Secondary | ICD-10-CM | POA: Diagnosis not present

## 2019-10-28 DIAGNOSIS — D1801 Hemangioma of skin and subcutaneous tissue: Secondary | ICD-10-CM | POA: Diagnosis not present

## 2019-10-28 DIAGNOSIS — Z85828 Personal history of other malignant neoplasm of skin: Secondary | ICD-10-CM | POA: Diagnosis not present

## 2019-10-28 DIAGNOSIS — L7211 Pilar cyst: Secondary | ICD-10-CM | POA: Diagnosis not present

## 2019-10-28 DIAGNOSIS — L570 Actinic keratosis: Secondary | ICD-10-CM | POA: Diagnosis not present

## 2019-10-28 DIAGNOSIS — L820 Inflamed seborrheic keratosis: Secondary | ICD-10-CM | POA: Diagnosis not present

## 2019-10-28 DIAGNOSIS — L821 Other seborrheic keratosis: Secondary | ICD-10-CM | POA: Diagnosis not present

## 2019-11-06 DIAGNOSIS — E7849 Other hyperlipidemia: Secondary | ICD-10-CM | POA: Diagnosis not present

## 2019-11-06 DIAGNOSIS — Z1212 Encounter for screening for malignant neoplasm of rectum: Secondary | ICD-10-CM | POA: Diagnosis not present

## 2019-11-06 DIAGNOSIS — I10 Essential (primary) hypertension: Secondary | ICD-10-CM | POA: Diagnosis not present

## 2019-11-06 DIAGNOSIS — M810 Age-related osteoporosis without current pathological fracture: Secondary | ICD-10-CM | POA: Diagnosis not present

## 2019-11-06 DIAGNOSIS — E038 Other specified hypothyroidism: Secondary | ICD-10-CM | POA: Diagnosis not present

## 2019-11-06 DIAGNOSIS — R82998 Other abnormal findings in urine: Secondary | ICD-10-CM | POA: Diagnosis not present

## 2019-11-13 DIAGNOSIS — E785 Hyperlipidemia, unspecified: Secondary | ICD-10-CM | POA: Diagnosis not present

## 2019-11-13 DIAGNOSIS — K219 Gastro-esophageal reflux disease without esophagitis: Secondary | ICD-10-CM | POA: Diagnosis not present

## 2019-11-13 DIAGNOSIS — G4733 Obstructive sleep apnea (adult) (pediatric): Secondary | ICD-10-CM | POA: Diagnosis not present

## 2019-11-13 DIAGNOSIS — J309 Allergic rhinitis, unspecified: Secondary | ICD-10-CM | POA: Diagnosis not present

## 2019-11-13 DIAGNOSIS — Z0000 Encounter for general adult medical examination without abnormal findings: Secondary | ICD-10-CM | POA: Diagnosis not present

## 2019-11-13 DIAGNOSIS — N3281 Overactive bladder: Secondary | ICD-10-CM | POA: Diagnosis not present

## 2019-11-13 DIAGNOSIS — I10 Essential (primary) hypertension: Secondary | ICD-10-CM | POA: Diagnosis not present

## 2019-11-13 DIAGNOSIS — E039 Hypothyroidism, unspecified: Secondary | ICD-10-CM | POA: Diagnosis not present

## 2019-11-13 DIAGNOSIS — M810 Age-related osteoporosis without current pathological fracture: Secondary | ICD-10-CM | POA: Diagnosis not present

## 2019-12-11 DIAGNOSIS — J309 Allergic rhinitis, unspecified: Secondary | ICD-10-CM | POA: Diagnosis not present

## 2019-12-11 DIAGNOSIS — H6121 Impacted cerumen, right ear: Secondary | ICD-10-CM | POA: Diagnosis not present

## 2019-12-11 DIAGNOSIS — M1990 Unspecified osteoarthritis, unspecified site: Secondary | ICD-10-CM | POA: Diagnosis not present

## 2019-12-11 DIAGNOSIS — Z1152 Encounter for screening for COVID-19: Secondary | ICD-10-CM | POA: Diagnosis not present

## 2019-12-11 DIAGNOSIS — M542 Cervicalgia: Secondary | ICD-10-CM | POA: Diagnosis not present

## 2019-12-17 DIAGNOSIS — L7211 Pilar cyst: Secondary | ICD-10-CM | POA: Diagnosis not present

## 2019-12-17 DIAGNOSIS — Z85828 Personal history of other malignant neoplasm of skin: Secondary | ICD-10-CM | POA: Diagnosis not present

## 2020-01-06 DIAGNOSIS — H6120 Impacted cerumen, unspecified ear: Secondary | ICD-10-CM | POA: Diagnosis not present

## 2020-01-20 DIAGNOSIS — Z23 Encounter for immunization: Secondary | ICD-10-CM | POA: Diagnosis not present

## 2020-01-20 DIAGNOSIS — E038 Other specified hypothyroidism: Secondary | ICD-10-CM | POA: Diagnosis not present

## 2020-04-07 DIAGNOSIS — M25561 Pain in right knee: Secondary | ICD-10-CM | POA: Diagnosis not present

## 2020-04-07 DIAGNOSIS — M25551 Pain in right hip: Secondary | ICD-10-CM | POA: Diagnosis not present

## 2020-04-12 ENCOUNTER — Other Ambulatory Visit: Payer: Self-pay | Admitting: Internal Medicine

## 2020-04-12 DIAGNOSIS — Z1231 Encounter for screening mammogram for malignant neoplasm of breast: Secondary | ICD-10-CM

## 2020-06-10 DIAGNOSIS — H524 Presbyopia: Secondary | ICD-10-CM | POA: Diagnosis not present

## 2020-06-22 DIAGNOSIS — R69 Illness, unspecified: Secondary | ICD-10-CM | POA: Diagnosis not present

## 2020-06-30 ENCOUNTER — Ambulatory Visit
Admission: RE | Admit: 2020-06-30 | Discharge: 2020-06-30 | Disposition: A | Discharge status: Home / Self Care | Payer: Medicare HMO | Source: Ambulatory Visit | Attending: Internal Medicine | Admitting: Internal Medicine

## 2020-06-30 ENCOUNTER — Other Ambulatory Visit: Payer: Self-pay

## 2020-06-30 DIAGNOSIS — Z1231 Encounter for screening mammogram for malignant neoplasm of breast: Secondary | ICD-10-CM

## 2020-08-05 ENCOUNTER — Other Ambulatory Visit: Payer: Self-pay

## 2020-08-05 ENCOUNTER — Ambulatory Visit
Admission: RE | Admit: 2020-08-05 | Discharge: 2020-08-05 | Disposition: A | Discharge status: Home / Self Care | Payer: Medicare HMO | Source: Ambulatory Visit | Attending: Internal Medicine | Admitting: Internal Medicine

## 2020-08-05 DIAGNOSIS — Z1231 Encounter for screening mammogram for malignant neoplasm of breast: Secondary | ICD-10-CM | POA: Diagnosis not present

## 2020-11-29 DIAGNOSIS — E039 Hypothyroidism, unspecified: Secondary | ICD-10-CM | POA: Diagnosis not present

## 2020-11-29 DIAGNOSIS — E785 Hyperlipidemia, unspecified: Secondary | ICD-10-CM | POA: Diagnosis not present

## 2020-11-29 DIAGNOSIS — M810 Age-related osteoporosis without current pathological fracture: Secondary | ICD-10-CM | POA: Diagnosis not present

## 2020-12-06 DIAGNOSIS — G4733 Obstructive sleep apnea (adult) (pediatric): Secondary | ICD-10-CM | POA: Diagnosis not present

## 2020-12-06 DIAGNOSIS — R82998 Other abnormal findings in urine: Secondary | ICD-10-CM | POA: Diagnosis not present

## 2020-12-06 DIAGNOSIS — R69 Illness, unspecified: Secondary | ICD-10-CM | POA: Diagnosis not present

## 2020-12-06 DIAGNOSIS — E785 Hyperlipidemia, unspecified: Secondary | ICD-10-CM | POA: Diagnosis not present

## 2020-12-06 DIAGNOSIS — M1990 Unspecified osteoarthritis, unspecified site: Secondary | ICD-10-CM | POA: Diagnosis not present

## 2020-12-06 DIAGNOSIS — E039 Hypothyroidism, unspecified: Secondary | ICD-10-CM | POA: Diagnosis not present

## 2020-12-06 DIAGNOSIS — I10 Essential (primary) hypertension: Secondary | ICD-10-CM | POA: Diagnosis not present

## 2020-12-06 DIAGNOSIS — Z1339 Encounter for screening examination for other mental health and behavioral disorders: Secondary | ICD-10-CM | POA: Diagnosis not present

## 2020-12-06 DIAGNOSIS — E876 Hypokalemia: Secondary | ICD-10-CM | POA: Diagnosis not present

## 2020-12-06 DIAGNOSIS — M810 Age-related osteoporosis without current pathological fracture: Secondary | ICD-10-CM | POA: Diagnosis not present

## 2020-12-06 DIAGNOSIS — Z1331 Encounter for screening for depression: Secondary | ICD-10-CM | POA: Diagnosis not present

## 2020-12-06 DIAGNOSIS — Z0000 Encounter for general adult medical examination without abnormal findings: Secondary | ICD-10-CM | POA: Diagnosis not present

## 2020-12-08 DIAGNOSIS — Z1212 Encounter for screening for malignant neoplasm of rectum: Secondary | ICD-10-CM | POA: Diagnosis not present

## 2021-01-03 DIAGNOSIS — E876 Hypokalemia: Secondary | ICD-10-CM | POA: Diagnosis not present

## 2021-01-03 DIAGNOSIS — G4733 Obstructive sleep apnea (adult) (pediatric): Secondary | ICD-10-CM | POA: Diagnosis not present

## 2021-01-03 DIAGNOSIS — I10 Essential (primary) hypertension: Secondary | ICD-10-CM | POA: Diagnosis not present

## 2021-01-03 DIAGNOSIS — R600 Localized edema: Secondary | ICD-10-CM | POA: Diagnosis not present

## 2021-02-06 IMAGING — MG DIGITAL SCREENING BILATERAL MAMMOGRAM WITH TOMO AND CAD
8 series · 8 of 24 positions shown · non-contrast
Comparison: Previous exam(s).

CLINICAL DATA: Screening.

EXAM:
DIGITAL SCREENING BILATERAL MAMMOGRAM WITH TOMO AND CAD

[R CC synth-2D]
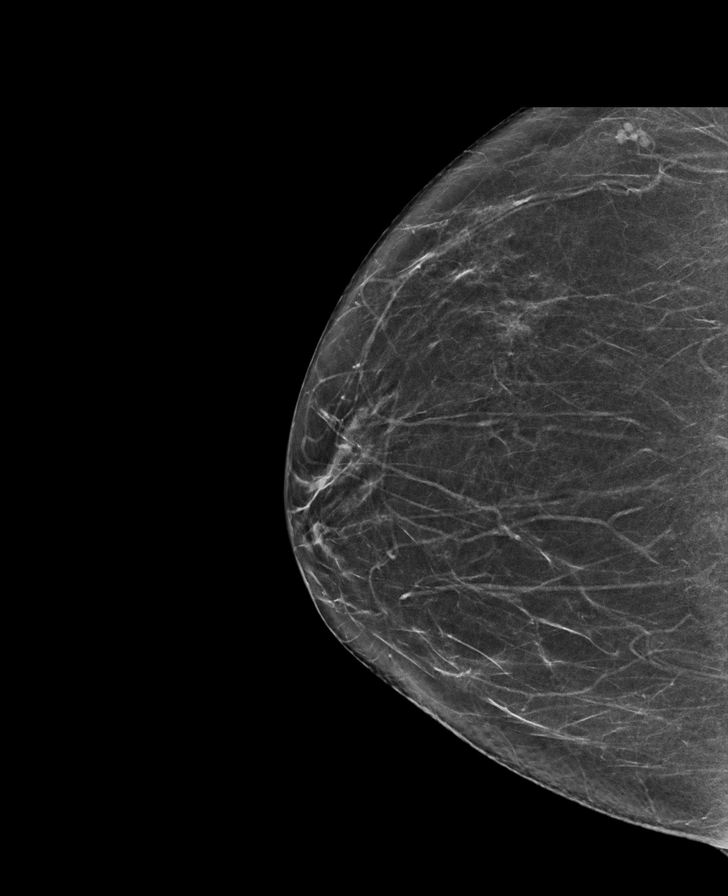

[L MLO synth-2D]
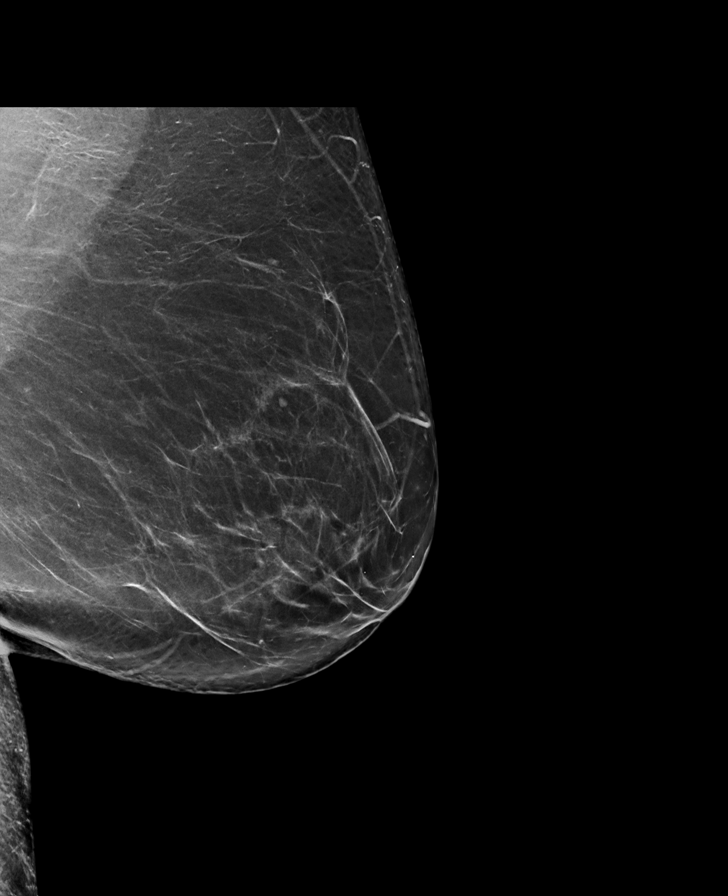

[L CC synth-2D]
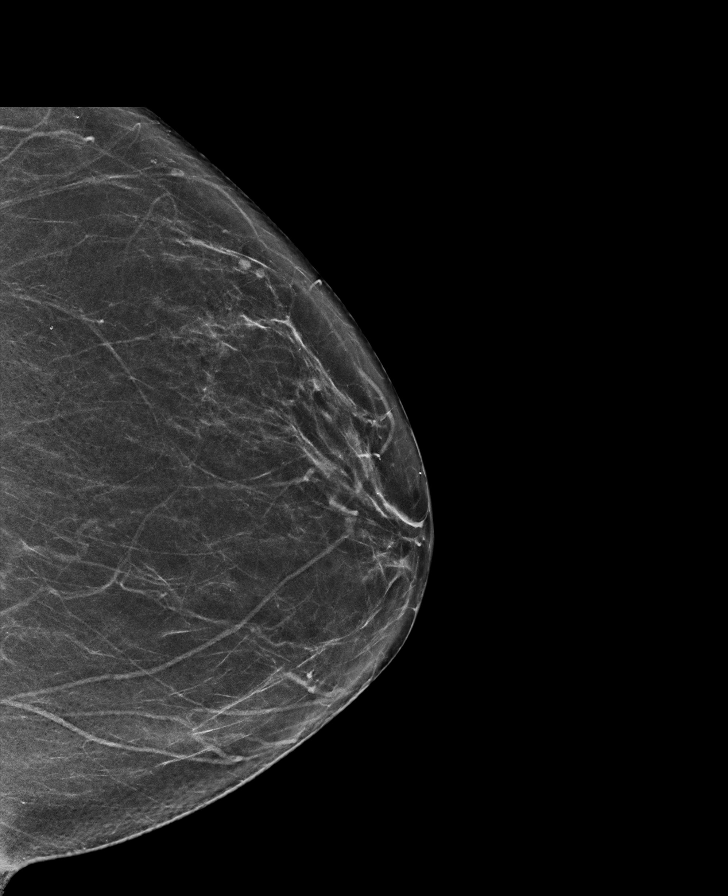

[R MLO synth-2D]
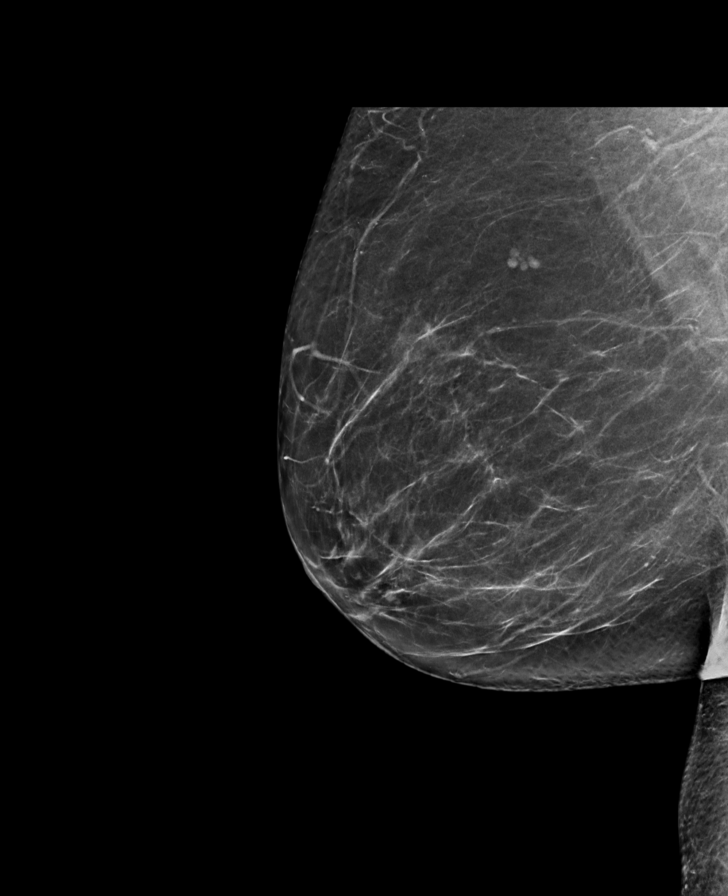

[R MLO tomo · tomo slice 39/78.0]
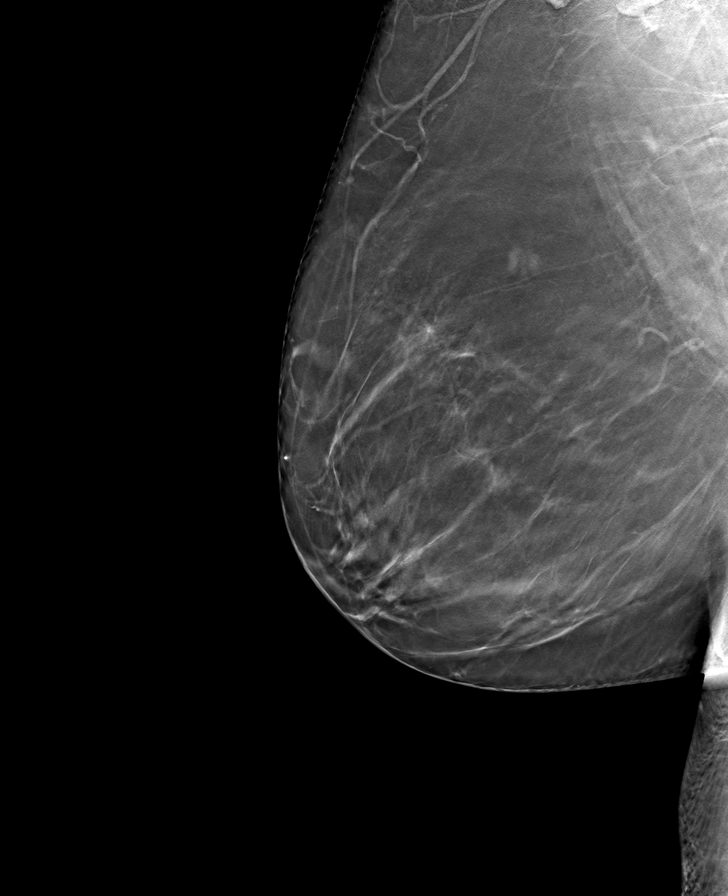

[L MLO tomo · tomo slice 43/85.0]
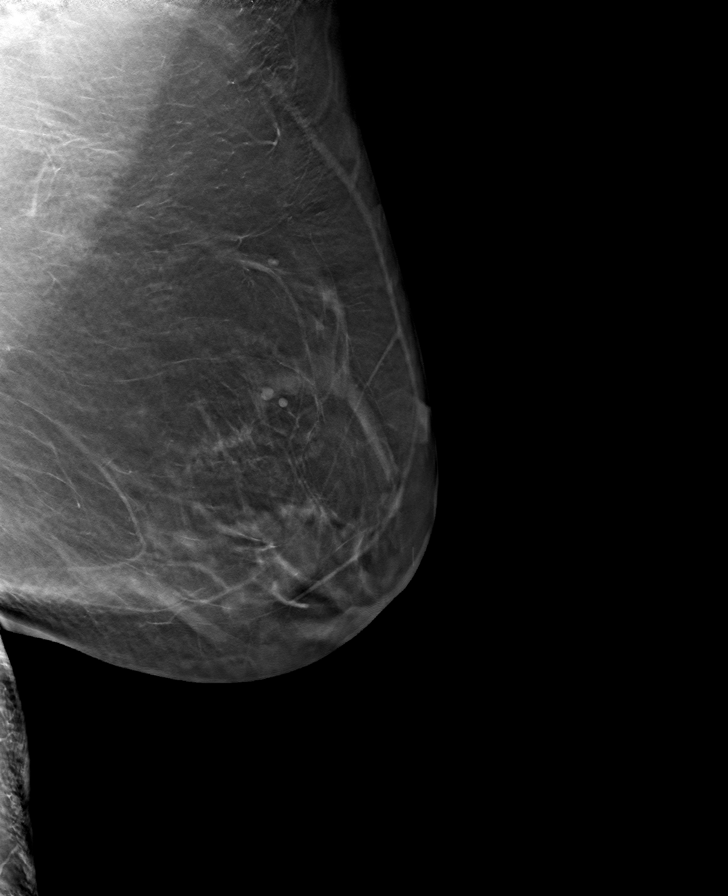

[R CC tomo · tomo slice 34/67.0]
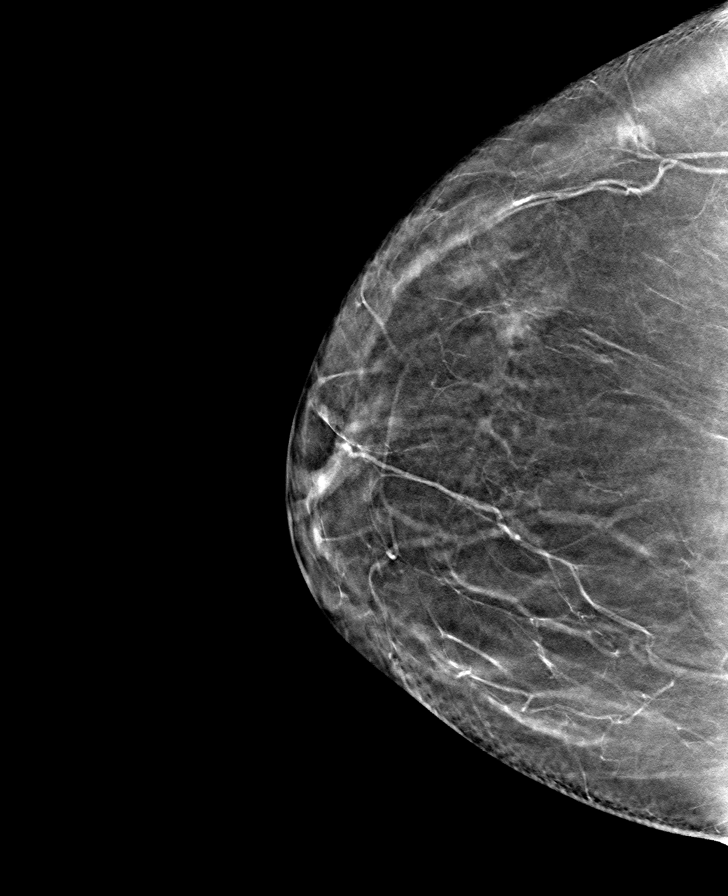

[L CC tomo · tomo slice 35/68.0]
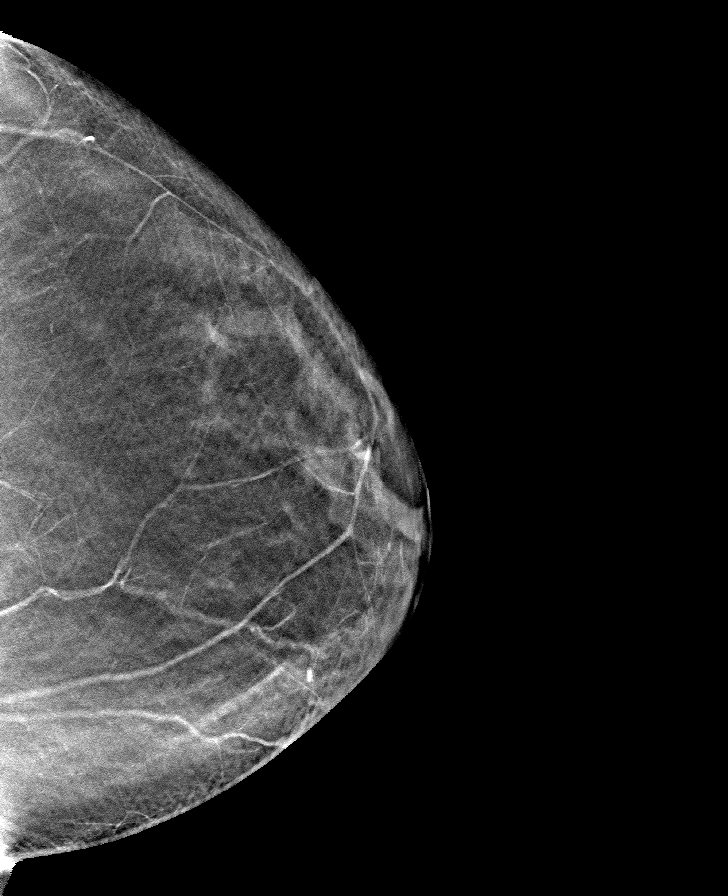

[8 of 24 positions shown; findings below may reference images not displayed]

ACR Breast Density Category b: There are scattered areas of
fibroglandular density.
FINDINGS: There are no findings suspicious for malignancy. Images were
processed with CAD.
IMPRESSION: No mammographic evidence of malignancy. A result letter of this
screening mammogram will be mailed directly to the patient.

RECOMMENDATION:
Screening mammogram in one year. (Code:[id number])

BI-RADS CATEGORY  1: Negative.

## 2021-03-18 DIAGNOSIS — I872 Venous insufficiency (chronic) (peripheral): Secondary | ICD-10-CM | POA: Diagnosis not present

## 2021-03-18 DIAGNOSIS — Z85828 Personal history of other malignant neoplasm of skin: Secondary | ICD-10-CM | POA: Diagnosis not present

## 2021-03-18 DIAGNOSIS — L723 Sebaceous cyst: Secondary | ICD-10-CM | POA: Diagnosis not present

## 2021-03-18 DIAGNOSIS — D1801 Hemangioma of skin and subcutaneous tissue: Secondary | ICD-10-CM | POA: Diagnosis not present

## 2021-03-18 DIAGNOSIS — L821 Other seborrheic keratosis: Secondary | ICD-10-CM | POA: Diagnosis not present

## 2021-03-18 DIAGNOSIS — D2221 Melanocytic nevi of right ear and external auricular canal: Secondary | ICD-10-CM | POA: Diagnosis not present

## 2021-03-18 DIAGNOSIS — L738 Other specified follicular disorders: Secondary | ICD-10-CM | POA: Diagnosis not present

## 2021-03-18 DIAGNOSIS — I8312 Varicose veins of left lower extremity with inflammation: Secondary | ICD-10-CM | POA: Diagnosis not present

## 2021-03-18 DIAGNOSIS — D2271 Melanocytic nevi of right lower limb, including hip: Secondary | ICD-10-CM | POA: Diagnosis not present

## 2021-03-18 DIAGNOSIS — I8311 Varicose veins of right lower extremity with inflammation: Secondary | ICD-10-CM | POA: Diagnosis not present

## 2021-03-28 DIAGNOSIS — G4733 Obstructive sleep apnea (adult) (pediatric): Secondary | ICD-10-CM | POA: Diagnosis not present

## 2021-03-28 DIAGNOSIS — R600 Localized edema: Secondary | ICD-10-CM | POA: Diagnosis not present

## 2021-03-28 DIAGNOSIS — R69 Illness, unspecified: Secondary | ICD-10-CM | POA: Diagnosis not present

## 2021-03-28 DIAGNOSIS — E785 Hyperlipidemia, unspecified: Secondary | ICD-10-CM | POA: Diagnosis not present

## 2021-03-28 DIAGNOSIS — I10 Essential (primary) hypertension: Secondary | ICD-10-CM | POA: Diagnosis not present

## 2021-03-28 DIAGNOSIS — E876 Hypokalemia: Secondary | ICD-10-CM | POA: Diagnosis not present

## 2021-03-28 DIAGNOSIS — E039 Hypothyroidism, unspecified: Secondary | ICD-10-CM | POA: Diagnosis not present

## 2021-03-28 DIAGNOSIS — M810 Age-related osteoporosis without current pathological fracture: Secondary | ICD-10-CM | POA: Diagnosis not present

## 2021-03-28 DIAGNOSIS — M1990 Unspecified osteoarthritis, unspecified site: Secondary | ICD-10-CM | POA: Diagnosis not present

## 2021-06-14 ENCOUNTER — Other Ambulatory Visit: Payer: Self-pay | Admitting: Internal Medicine

## 2021-06-14 DIAGNOSIS — H25813 Combined forms of age-related cataract, bilateral: Secondary | ICD-10-CM | POA: Diagnosis not present

## 2021-06-14 DIAGNOSIS — H524 Presbyopia: Secondary | ICD-10-CM | POA: Diagnosis not present

## 2021-06-14 DIAGNOSIS — H2513 Age-related nuclear cataract, bilateral: Secondary | ICD-10-CM | POA: Diagnosis not present

## 2021-06-14 DIAGNOSIS — H04122 Dry eye syndrome of left lacrimal gland: Secondary | ICD-10-CM | POA: Diagnosis not present

## 2021-06-14 DIAGNOSIS — H5213 Myopia, bilateral: Secondary | ICD-10-CM | POA: Diagnosis not present

## 2021-06-14 DIAGNOSIS — Z1231 Encounter for screening mammogram for malignant neoplasm of breast: Secondary | ICD-10-CM

## 2021-06-14 DIAGNOSIS — I10 Essential (primary) hypertension: Secondary | ICD-10-CM | POA: Diagnosis not present

## 2021-06-14 DIAGNOSIS — H04121 Dry eye syndrome of right lacrimal gland: Secondary | ICD-10-CM | POA: Diagnosis not present

## 2021-06-14 DIAGNOSIS — Z0100 Encounter for examination of eyes and vision without abnormal findings: Secondary | ICD-10-CM | POA: Diagnosis not present

## 2021-06-14 DIAGNOSIS — H52221 Regular astigmatism, right eye: Secondary | ICD-10-CM | POA: Diagnosis not present

## 2021-06-14 DIAGNOSIS — H43813 Vitreous degeneration, bilateral: Secondary | ICD-10-CM | POA: Diagnosis not present

## 2021-06-14 DIAGNOSIS — H81399 Other peripheral vertigo, unspecified ear: Secondary | ICD-10-CM | POA: Diagnosis not present

## 2021-06-14 DIAGNOSIS — T7840XD Allergy, unspecified, subsequent encounter: Secondary | ICD-10-CM | POA: Diagnosis not present

## 2021-08-08 ENCOUNTER — Other Ambulatory Visit: Payer: Self-pay

## 2021-08-08 ENCOUNTER — Ambulatory Visit
Admission: RE | Admit: 2021-08-08 | Discharge: 2021-08-08 | Disposition: A | Discharge status: Home / Self Care | Payer: Medicare HMO | Source: Ambulatory Visit | Attending: Internal Medicine | Admitting: Internal Medicine

## 2021-08-08 DIAGNOSIS — Z1231 Encounter for screening mammogram for malignant neoplasm of breast: Secondary | ICD-10-CM | POA: Diagnosis not present

## 2021-11-03 DIAGNOSIS — R059 Cough, unspecified: Secondary | ICD-10-CM | POA: Diagnosis not present

## 2021-11-03 DIAGNOSIS — J029 Acute pharyngitis, unspecified: Secondary | ICD-10-CM | POA: Diagnosis not present

## 2021-11-03 DIAGNOSIS — Z1152 Encounter for screening for COVID-19: Secondary | ICD-10-CM | POA: Diagnosis not present

## 2021-11-03 DIAGNOSIS — R0981 Nasal congestion: Secondary | ICD-10-CM | POA: Diagnosis not present

## 2021-11-03 DIAGNOSIS — U071 COVID-19: Secondary | ICD-10-CM | POA: Diagnosis not present

## 2021-11-03 DIAGNOSIS — R5383 Other fatigue: Secondary | ICD-10-CM | POA: Diagnosis not present

## 2021-12-21 DIAGNOSIS — E039 Hypothyroidism, unspecified: Secondary | ICD-10-CM | POA: Diagnosis not present

## 2021-12-21 DIAGNOSIS — M810 Age-related osteoporosis without current pathological fracture: Secondary | ICD-10-CM | POA: Diagnosis not present

## 2021-12-21 DIAGNOSIS — E785 Hyperlipidemia, unspecified: Secondary | ICD-10-CM | POA: Diagnosis not present

## 2021-12-21 DIAGNOSIS — I10 Essential (primary) hypertension: Secondary | ICD-10-CM | POA: Diagnosis not present

## 2022-01-09 DIAGNOSIS — E876 Hypokalemia: Secondary | ICD-10-CM | POA: Diagnosis not present

## 2022-01-09 DIAGNOSIS — M1990 Unspecified osteoarthritis, unspecified site: Secondary | ICD-10-CM | POA: Diagnosis not present

## 2022-01-09 DIAGNOSIS — E785 Hyperlipidemia, unspecified: Secondary | ICD-10-CM | POA: Diagnosis not present

## 2022-01-09 DIAGNOSIS — Z0000 Encounter for general adult medical examination without abnormal findings: Secondary | ICD-10-CM | POA: Diagnosis not present

## 2022-01-09 DIAGNOSIS — G4733 Obstructive sleep apnea (adult) (pediatric): Secondary | ICD-10-CM | POA: Diagnosis not present

## 2022-01-09 DIAGNOSIS — E039 Hypothyroidism, unspecified: Secondary | ICD-10-CM | POA: Diagnosis not present

## 2022-01-09 DIAGNOSIS — M810 Age-related osteoporosis without current pathological fracture: Secondary | ICD-10-CM | POA: Diagnosis not present

## 2022-01-09 DIAGNOSIS — R600 Localized edema: Secondary | ICD-10-CM | POA: Diagnosis not present

## 2022-01-09 DIAGNOSIS — I10 Essential (primary) hypertension: Secondary | ICD-10-CM | POA: Diagnosis not present

## 2022-01-10 DIAGNOSIS — R82998 Other abnormal findings in urine: Secondary | ICD-10-CM | POA: Diagnosis not present

## 2022-01-10 DIAGNOSIS — Z1212 Encounter for screening for malignant neoplasm of rectum: Secondary | ICD-10-CM | POA: Diagnosis not present

## 2022-03-09 ENCOUNTER — Other Ambulatory Visit: Payer: Self-pay | Admitting: Internal Medicine

## 2022-03-09 DIAGNOSIS — Z1231 Encounter for screening mammogram for malignant neoplasm of breast: Secondary | ICD-10-CM

## 2022-04-19 DIAGNOSIS — L570 Actinic keratosis: Secondary | ICD-10-CM | POA: Diagnosis not present

## 2022-04-19 DIAGNOSIS — D485 Neoplasm of uncertain behavior of skin: Secondary | ICD-10-CM | POA: Diagnosis not present

## 2022-04-19 DIAGNOSIS — L7211 Pilar cyst: Secondary | ICD-10-CM | POA: Diagnosis not present

## 2022-04-19 DIAGNOSIS — D2221 Melanocytic nevi of right ear and external auricular canal: Secondary | ICD-10-CM | POA: Diagnosis not present

## 2022-04-19 DIAGNOSIS — Z85828 Personal history of other malignant neoplasm of skin: Secondary | ICD-10-CM | POA: Diagnosis not present

## 2022-04-19 DIAGNOSIS — L821 Other seborrheic keratosis: Secondary | ICD-10-CM | POA: Diagnosis not present

## 2022-04-19 DIAGNOSIS — D225 Melanocytic nevi of trunk: Secondary | ICD-10-CM | POA: Diagnosis not present

## 2022-04-19 DIAGNOSIS — D2272 Melanocytic nevi of left lower limb, including hip: Secondary | ICD-10-CM | POA: Diagnosis not present

## 2022-04-19 DIAGNOSIS — L820 Inflamed seborrheic keratosis: Secondary | ICD-10-CM | POA: Diagnosis not present

## 2022-05-22 DIAGNOSIS — M1990 Unspecified osteoarthritis, unspecified site: Secondary | ICD-10-CM | POA: Diagnosis not present

## 2022-05-22 DIAGNOSIS — G4733 Obstructive sleep apnea (adult) (pediatric): Secondary | ICD-10-CM | POA: Diagnosis not present

## 2022-05-22 DIAGNOSIS — E876 Hypokalemia: Secondary | ICD-10-CM | POA: Diagnosis not present

## 2022-05-22 DIAGNOSIS — M810 Age-related osteoporosis without current pathological fracture: Secondary | ICD-10-CM | POA: Diagnosis not present

## 2022-05-22 DIAGNOSIS — R600 Localized edema: Secondary | ICD-10-CM | POA: Diagnosis not present

## 2022-05-22 DIAGNOSIS — I10 Essential (primary) hypertension: Secondary | ICD-10-CM | POA: Diagnosis not present

## 2022-05-22 DIAGNOSIS — N3281 Overactive bladder: Secondary | ICD-10-CM | POA: Diagnosis not present

## 2022-05-22 DIAGNOSIS — F419 Anxiety disorder, unspecified: Secondary | ICD-10-CM | POA: Diagnosis not present

## 2022-05-22 DIAGNOSIS — E785 Hyperlipidemia, unspecified: Secondary | ICD-10-CM | POA: Diagnosis not present

## 2022-05-22 DIAGNOSIS — E039 Hypothyroidism, unspecified: Secondary | ICD-10-CM | POA: Diagnosis not present

## 2022-07-05 DIAGNOSIS — R059 Cough, unspecified: Secondary | ICD-10-CM | POA: Diagnosis not present

## 2022-07-05 DIAGNOSIS — Z1152 Encounter for screening for COVID-19: Secondary | ICD-10-CM | POA: Diagnosis not present

## 2022-07-05 DIAGNOSIS — I10 Essential (primary) hypertension: Secondary | ICD-10-CM | POA: Diagnosis not present

## 2022-07-05 DIAGNOSIS — M25561 Pain in right knee: Secondary | ICD-10-CM | POA: Diagnosis not present

## 2022-07-05 DIAGNOSIS — M810 Age-related osteoporosis without current pathological fracture: Secondary | ICD-10-CM | POA: Diagnosis not present

## 2022-07-05 DIAGNOSIS — R062 Wheezing: Secondary | ICD-10-CM | POA: Diagnosis not present

## 2022-07-05 DIAGNOSIS — R5383 Other fatigue: Secondary | ICD-10-CM | POA: Diagnosis not present

## 2022-08-10 ENCOUNTER — Ambulatory Visit
Admission: RE | Admit: 2022-08-10 | Discharge: 2022-08-10 | Disposition: A | Discharge status: Home / Self Care | Payer: Medicare HMO | Source: Ambulatory Visit | Attending: Internal Medicine | Admitting: Internal Medicine

## 2022-08-10 DIAGNOSIS — Z1231 Encounter for screening mammogram for malignant neoplasm of breast: Secondary | ICD-10-CM

## 2022-08-15 DIAGNOSIS — H5213 Myopia, bilateral: Secondary | ICD-10-CM | POA: Diagnosis not present

## 2022-11-15 DIAGNOSIS — L720 Epidermal cyst: Secondary | ICD-10-CM | POA: Diagnosis not present

## 2022-11-15 DIAGNOSIS — L7211 Pilar cyst: Secondary | ICD-10-CM | POA: Diagnosis not present

## 2022-12-10 DIAGNOSIS — M1712 Unilateral primary osteoarthritis, left knee: Secondary | ICD-10-CM | POA: Diagnosis not present

## 2022-12-10 DIAGNOSIS — M25562 Pain in left knee: Secondary | ICD-10-CM | POA: Diagnosis not present

## 2023-01-22 DIAGNOSIS — M810 Age-related osteoporosis without current pathological fracture: Secondary | ICD-10-CM | POA: Diagnosis not present

## 2023-01-22 DIAGNOSIS — K219 Gastro-esophageal reflux disease without esophagitis: Secondary | ICD-10-CM | POA: Diagnosis not present

## 2023-01-22 DIAGNOSIS — I10 Essential (primary) hypertension: Secondary | ICD-10-CM | POA: Diagnosis not present

## 2023-01-22 DIAGNOSIS — E785 Hyperlipidemia, unspecified: Secondary | ICD-10-CM | POA: Diagnosis not present

## 2023-01-22 DIAGNOSIS — E039 Hypothyroidism, unspecified: Secondary | ICD-10-CM | POA: Diagnosis not present

## 2023-01-23 DIAGNOSIS — I10 Essential (primary) hypertension: Secondary | ICD-10-CM | POA: Diagnosis not present

## 2023-01-23 DIAGNOSIS — Z1212 Encounter for screening for malignant neoplasm of rectum: Secondary | ICD-10-CM | POA: Diagnosis not present

## 2023-01-23 DIAGNOSIS — R82998 Other abnormal findings in urine: Secondary | ICD-10-CM | POA: Diagnosis not present

## 2023-01-29 DIAGNOSIS — I10 Essential (primary) hypertension: Secondary | ICD-10-CM | POA: Diagnosis not present

## 2023-01-29 DIAGNOSIS — Z0000 Encounter for general adult medical examination without abnormal findings: Secondary | ICD-10-CM | POA: Diagnosis not present

## 2023-01-29 DIAGNOSIS — F419 Anxiety disorder, unspecified: Secondary | ICD-10-CM | POA: Diagnosis not present

## 2023-01-29 DIAGNOSIS — E039 Hypothyroidism, unspecified: Secondary | ICD-10-CM | POA: Diagnosis not present

## 2023-01-29 DIAGNOSIS — Z6841 Body Mass Index (BMI) 40.0 and over, adult: Secondary | ICD-10-CM | POA: Diagnosis not present

## 2023-01-29 DIAGNOSIS — Z23 Encounter for immunization: Secondary | ICD-10-CM | POA: Diagnosis not present

## 2023-01-29 DIAGNOSIS — N3281 Overactive bladder: Secondary | ICD-10-CM | POA: Diagnosis not present

## 2023-01-29 DIAGNOSIS — R600 Localized edema: Secondary | ICD-10-CM | POA: Diagnosis not present

## 2023-01-29 DIAGNOSIS — E785 Hyperlipidemia, unspecified: Secondary | ICD-10-CM | POA: Diagnosis not present

## 2023-01-29 DIAGNOSIS — M1990 Unspecified osteoarthritis, unspecified site: Secondary | ICD-10-CM | POA: Diagnosis not present

## 2023-01-29 DIAGNOSIS — M810 Age-related osteoporosis without current pathological fracture: Secondary | ICD-10-CM | POA: Diagnosis not present

## 2023-01-30 ENCOUNTER — Other Ambulatory Visit: Payer: Self-pay | Admitting: Internal Medicine

## 2023-01-30 DIAGNOSIS — Z1231 Encounter for screening mammogram for malignant neoplasm of breast: Secondary | ICD-10-CM

## 2023-04-09 DIAGNOSIS — Z85828 Personal history of other malignant neoplasm of skin: Secondary | ICD-10-CM | POA: Diagnosis not present

## 2023-04-09 DIAGNOSIS — D485 Neoplasm of uncertain behavior of skin: Secondary | ICD-10-CM | POA: Diagnosis not present

## 2023-04-09 DIAGNOSIS — L7211 Pilar cyst: Secondary | ICD-10-CM | POA: Diagnosis not present

## 2023-04-09 DIAGNOSIS — L821 Other seborrheic keratosis: Secondary | ICD-10-CM | POA: Diagnosis not present

## 2023-07-10 DIAGNOSIS — L723 Sebaceous cyst: Secondary | ICD-10-CM | POA: Diagnosis not present

## 2023-07-10 DIAGNOSIS — Z85828 Personal history of other malignant neoplasm of skin: Secondary | ICD-10-CM | POA: Diagnosis not present

## 2023-07-10 DIAGNOSIS — D485 Neoplasm of uncertain behavior of skin: Secondary | ICD-10-CM | POA: Diagnosis not present

## 2023-07-10 DIAGNOSIS — L570 Actinic keratosis: Secondary | ICD-10-CM | POA: Diagnosis not present

## 2023-07-10 DIAGNOSIS — L817 Pigmented purpuric dermatosis: Secondary | ICD-10-CM | POA: Diagnosis not present

## 2023-07-10 DIAGNOSIS — L918 Other hypertrophic disorders of the skin: Secondary | ICD-10-CM | POA: Diagnosis not present

## 2023-07-10 DIAGNOSIS — L821 Other seborrheic keratosis: Secondary | ICD-10-CM | POA: Diagnosis not present

## 2023-07-10 DIAGNOSIS — D1801 Hemangioma of skin and subcutaneous tissue: Secondary | ICD-10-CM | POA: Diagnosis not present

## 2023-08-13 ENCOUNTER — Ambulatory Visit: Payer: Medicare HMO

## 2023-08-14 ENCOUNTER — Ambulatory Visit
Admission: RE | Admit: 2023-08-14 | Discharge: 2023-08-14 | Disposition: A | Discharge status: Home / Self Care | Payer: Medicare HMO | Source: Ambulatory Visit | Attending: Internal Medicine | Admitting: Internal Medicine

## 2023-08-14 DIAGNOSIS — Z1231 Encounter for screening mammogram for malignant neoplasm of breast: Secondary | ICD-10-CM | POA: Diagnosis not present

## 2023-08-16 DIAGNOSIS — H5213 Myopia, bilateral: Secondary | ICD-10-CM | POA: Diagnosis not present

## 2023-12-07 ENCOUNTER — Other Ambulatory Visit: Payer: Self-pay | Admitting: Internal Medicine

## 2023-12-07 DIAGNOSIS — Z1231 Encounter for screening mammogram for malignant neoplasm of breast: Secondary | ICD-10-CM

## 2024-01-30 DIAGNOSIS — Z1212 Encounter for screening for malignant neoplasm of rectum: Secondary | ICD-10-CM | POA: Diagnosis not present

## 2024-01-30 DIAGNOSIS — M810 Age-related osteoporosis without current pathological fracture: Secondary | ICD-10-CM | POA: Diagnosis not present

## 2024-01-30 DIAGNOSIS — E785 Hyperlipidemia, unspecified: Secondary | ICD-10-CM | POA: Diagnosis not present

## 2024-01-30 DIAGNOSIS — E039 Hypothyroidism, unspecified: Secondary | ICD-10-CM | POA: Diagnosis not present

## 2024-01-30 DIAGNOSIS — I10 Essential (primary) hypertension: Secondary | ICD-10-CM | POA: Diagnosis not present

## 2024-02-04 DIAGNOSIS — E039 Hypothyroidism, unspecified: Secondary | ICD-10-CM | POA: Diagnosis not present

## 2024-02-04 DIAGNOSIS — E785 Hyperlipidemia, unspecified: Secondary | ICD-10-CM | POA: Diagnosis not present

## 2024-02-04 DIAGNOSIS — Z1212 Encounter for screening for malignant neoplasm of rectum: Secondary | ICD-10-CM | POA: Diagnosis not present

## 2024-02-06 DIAGNOSIS — G4733 Obstructive sleep apnea (adult) (pediatric): Secondary | ICD-10-CM | POA: Diagnosis not present

## 2024-02-06 DIAGNOSIS — Z1331 Encounter for screening for depression: Secondary | ICD-10-CM | POA: Diagnosis not present

## 2024-02-06 DIAGNOSIS — R82998 Other abnormal findings in urine: Secondary | ICD-10-CM | POA: Diagnosis not present

## 2024-02-06 DIAGNOSIS — K5730 Diverticulosis of large intestine without perforation or abscess without bleeding: Secondary | ICD-10-CM | POA: Diagnosis not present

## 2024-02-06 DIAGNOSIS — M810 Age-related osteoporosis without current pathological fracture: Secondary | ICD-10-CM | POA: Diagnosis not present

## 2024-02-06 DIAGNOSIS — M1990 Unspecified osteoarthritis, unspecified site: Secondary | ICD-10-CM | POA: Diagnosis not present

## 2024-02-06 DIAGNOSIS — I10 Essential (primary) hypertension: Secondary | ICD-10-CM | POA: Diagnosis not present

## 2024-02-06 DIAGNOSIS — Z0000 Encounter for general adult medical examination without abnormal findings: Secondary | ICD-10-CM | POA: Diagnosis not present

## 2024-02-06 DIAGNOSIS — Z1211 Encounter for screening for malignant neoplasm of colon: Secondary | ICD-10-CM | POA: Diagnosis not present

## 2024-02-06 DIAGNOSIS — Z1339 Encounter for screening examination for other mental health and behavioral disorders: Secondary | ICD-10-CM | POA: Diagnosis not present

## 2024-02-06 DIAGNOSIS — R600 Localized edema: Secondary | ICD-10-CM | POA: Diagnosis not present

## 2024-02-06 DIAGNOSIS — E876 Hypokalemia: Secondary | ICD-10-CM | POA: Diagnosis not present

## 2024-02-06 DIAGNOSIS — N3281 Overactive bladder: Secondary | ICD-10-CM | POA: Diagnosis not present

## 2024-02-06 DIAGNOSIS — Z6841 Body Mass Index (BMI) 40.0 and over, adult: Secondary | ICD-10-CM | POA: Diagnosis not present

## 2024-02-06 DIAGNOSIS — E785 Hyperlipidemia, unspecified: Secondary | ICD-10-CM | POA: Diagnosis not present

## 2024-02-06 DIAGNOSIS — E039 Hypothyroidism, unspecified: Secondary | ICD-10-CM | POA: Diagnosis not present

## 2024-02-08 ENCOUNTER — Encounter: Payer: Self-pay | Admitting: Nurse Practitioner

## 2024-03-20 ENCOUNTER — Ambulatory Visit: Admitting: Podiatry

## 2024-03-20 ENCOUNTER — Ambulatory Visit (INDEPENDENT_AMBULATORY_CARE_PROVIDER_SITE_OTHER)

## 2024-03-20 VITALS — Ht 63.0 in | Wt 265.0 lb

## 2024-03-20 DIAGNOSIS — M19079 Primary osteoarthritis, unspecified ankle and foot: Secondary | ICD-10-CM | POA: Diagnosis not present

## 2024-03-20 DIAGNOSIS — M779 Enthesopathy, unspecified: Secondary | ICD-10-CM

## 2024-03-20 DIAGNOSIS — Q828 Other specified congenital malformations of skin: Secondary | ICD-10-CM | POA: Diagnosis not present

## 2024-03-20 DIAGNOSIS — M722 Plantar fascial fibromatosis: Secondary | ICD-10-CM

## 2024-03-20 NOTE — Patient Instructions (Addendum)
 Achilles Tendinitis  with Rehab Achilles tendinitis is a disorder of the Achilles tendon. The Achilles tendon connects the large calf muscles (Gastrocnemius and Soleus) to the heel bone (calcaneus). This tendon is sometimes called the heel cord. It is important for pushing-off and standing on your toes and is important for walking, running, or jumping. Tendinitis is often caused by overuse and repetitive microtrauma. SYMPTOMS Pain, tenderness, swelling, warmth, and redness may occur over the Achilles tendon even at rest. Pain with pushing off, or flexing or extending the ankle. Pain that is worsened after or during activity. CAUSES  Overuse sometimes seen with rapid increase in exercise programs or in sports requiring running and jumping. Poor physical conditioning (strength and flexibility or endurance). Running sports, especially training running down hills. Inadequate warm-up before practice or play or failure to stretch before participation. Injury to the tendon. PREVENTION  Warm up and stretch before practice or competition. Allow time for adequate rest and recovery between practices and competition. Keep up conditioning. Keep up ankle and leg flexibility. Improve or keep muscle strength and endurance. Improve cardiovascular fitness. Use proper technique. Use proper equipment (shoes, skates). To help prevent recurrence, taping, protective strapping, or an adhesive bandage may be recommended for several weeks after healing is complete. PROGNOSIS  Recovery may take weeks to several months to heal. Longer recovery is expected if symptoms have been prolonged. Recovery is usually quicker if the inflammation is due to a direct blow as compared with overuse or sudden strain. RELATED COMPLICATIONS  Healing time will be prolonged if the condition is not correctly treated. The injury must be given plenty of time to heal. Symptoms can reoccur if activity is resumed too soon. Untreated,  tendinitis may increase the risk of tendon rupture requiring additional time for recovery and possibly surgery. TREATMENT  The first treatment consists of rest anti-inflammatory medication, and ice to relieve the pain. Stretching and strengthening exercises after resolution of pain will likely help reduce the risk of recurrence. Referral to a physical therapist or athletic trainer for further evaluation and treatment may be helpful. A walking boot or cast may be recommended to rest the Achilles tendon. This can help break the cycle of inflammation and microtrauma. Arch supports (orthotics) may be prescribed or recommended by your caregiver as an adjunct to therapy and rest. Surgery to remove the inflamed tendon lining or degenerated tendon tissue is rarely necessary and has shown less than predictable results. MEDICATION  Nonsteroidal anti-inflammatory medications, such as aspirin and ibuprofen, may be used for pain and inflammation relief. Do not take within 7 days before surgery. Take these as directed by your caregiver. Contact your caregiver immediately if any bleeding, stomach upset, or signs of allergic reaction occur. Other minor pain relievers, such as acetaminophen, may also be used. Pain relievers may be prescribed as necessary by your caregiver. Do not take prescription pain medication for longer than 4 to 7 days. Use only as directed and only as much as you need. Cortisone injections are rarely indicated. Cortisone injections may weaken tendons and predispose to rupture. It is better to give the condition more time to heal than to use them. HEAT AND COLD Cold is used to relieve pain and reduce inflammation for acute and chronic Achilles tendinitis. Cold should be applied for 10 to 15 minutes every 2 to 3 hours for inflammation and pain and immediately after any activity that aggravates your symptoms. Use ice packs or an ice massage. Heat may be used before performing stretching  and  strengthening activities prescribed by your caregiver. Use a heat pack or a warm soak. SEEK MEDICAL CARE IF: Symptoms get worse or do not improve in 2 weeks despite treatment. New, unexplained symptoms develop. Drugs used in treatment may produce side effects.  EXERCISES:  RANGE OF MOTION (ROM) AND STRETCHING EXERCISES - Achilles Tendinitis  These exercises may help you when beginning to rehabilitate your injury. Your symptoms may resolve with or without further involvement from your physician, physical therapist or athletic trainer. While completing these exercises, remember:  Restoring tissue flexibility helps normal motion to return to the joints. This allows healthier, less painful movement and activity. An effective stretch should be held for at least 30 seconds. A stretch should never be painful. You should only feel a gentle lengthening or release in the stretched tissue.  STRETCH  Gastroc, Standing  Place hands on wall. Extend right / left leg, keeping the front knee somewhat bent. Slightly point your toes inward on your back foot. Keeping your right / left heel on the floor and your knee straight, shift your weight toward the wall, not allowing your back to arch. You should feel a gentle stretch in the right / left calf. Hold this position for 10 seconds. Repeat 3 times. Complete this stretch 2 times per day.  STRETCH  Soleus, Standing  Place hands on wall. Extend right / left leg, keeping the other knee somewhat bent. Slightly point your toes inward on your back foot. Keep your right / left heel on the floor, bend your back knee, and slightly shift your weight over the back leg so that you feel a gentle stretch deep in your back calf. Hold this position for 10 seconds. Repeat 3 times. Complete this stretch 2 times per day.  STRETCH  Gastrocsoleus, Standing  Note: This exercise can place a lot of stress on your foot and ankle. Please complete this exercise only if specifically  instructed by your caregiver.  Place the ball of your right / left foot on a step, keeping your other foot firmly on the same step. Hold on to the wall or a rail for balance. Slowly lift your other foot, allowing your body weight to press your heel down over the edge of the step. You should feel a stretch in your right / left calf. Hold this position for 10 seconds. Repeat this exercise with a slight bend in your knee. Repeat 3 times. Complete this stretch 2 times per day.   STRENGTHENING EXERCISES - Achilles Tendinitis These exercises may help you when beginning to rehabilitate your injury. They may resolve your symptoms with or without further involvement from your physician, physical therapist or athletic trainer. While completing these exercises, remember:  Muscles can gain both the endurance and the strength needed for everyday activities through controlled exercises. Complete these exercises as instructed by your physician, physical therapist or athletic trainer. Progress the resistance and repetitions only as guided. You may experience muscle soreness or fatigue, but the pain or discomfort you are trying to eliminate should never worsen during these exercises. If this pain does worsen, stop and make certain you are following the directions exactly. If the pain is still present after adjustments, discontinue the exercise until you can discuss the trouble with your clinician.  STRENGTH - Plantar-flexors  Sit with your right / left leg extended. Holding onto both ends of a rubber exercise band/tubing, loop it around the ball of your foot. Keep a slight tension in the band. Slowly  push your toes away from you, pointing them downward. Hold this position for 10 seconds. Return slowly, controlling the tension in the band/tubing. Repeat 3 times. Complete this exercise 2 times per day.   STRENGTH - Plantar-flexors  Stand with your feet shoulder width apart. Steady yourself with a wall or table  using as little support as needed. Keeping your weight evenly spread over the width of your feet, rise up on your toes.* Hold this position for 10 seconds. Repeat 3 times. Complete this exercise 2 times per day.  *If this is too easy, shift your weight toward your right / left leg until you feel challenged. Ultimately, you may be asked to do this exercise with your right / left foot only.  STRENGTH  Plantar-flexors, Eccentric  Note: This exercise can place a lot of stress on your foot and ankle. Please complete this exercise only if specifically instructed by your caregiver.  Place the balls of your feet on a step. With your hands, use only enough support from a wall or rail to keep your balance. Keep your knees straight and rise up on your toes. Slowly shift your weight entirely to your right / left toes and pick up your opposite foot. Gently and with controlled movement, lower your weight through your right / left foot so that your heel drops below the level of the step. You will feel a slight stretch in the back of your calf at the end position. Use the healthy leg to help rise up onto the balls of both feet, then lower weight only on the right / left leg again. Build up to 15 repetitions. Then progress to 3 consecutive sets of 15 repetitions.* After completing the above exercise, complete the same exercise with a slight knee bend (about 30 degrees). Again, build up to 15 repetitions. Then progress to 3 consecutive sets of 15 repetitions.* Perform this exercise 2 times per day.  *When you easily complete 3 sets of 15, your physician, physical therapist or athletic trainer may advise you to add resistance by wearing a backpack filled with additional weight.  STRENGTH - Plantar Flexors, Seated  Sit on a chair that allows your feet to rest flat on the ground. If necessary, sit at the edge of the chair. Keeping your toes firmly on the ground, lift your right / left heel as far as you can without  increasing any discomfort in your ankle. Repeat 3 times. Complete this exercise 2 times a day.  VISIT SUMMARY: Today, you were seen for persistent foot pain due to plantar fasciitis, arthritis, bursitis, and painful lesions on your feet. We discussed your current symptoms, concerns about medication side effects, and potential treatment options.  YOUR PLAN: -PLANTAR FASCIITIS WITH BONE SPUR: Plantar fasciitis is inflammation of the tissue on the bottom of your foot, often caused by a bone spur. We administered a steroid injection to your right heel to help reduce inflammation and pain. You should perform home physical therapy exercises, and if these are not enough, we may refer you to a physical therapist. We also discussed switching to Celecoxib for better tolerance by your stomach. Please follow up if your symptoms persist beyond six to eight weeks for a potential second injection.  -POROKERATOTIC LESIONS ON BILATERAL FEET: Porokeratotic lesions are thickened areas of skin that can be painful. We debrided these lesions and applied salicylic acid to help them peel. You should use over-the-counter salicylic acid pads with 40% strength for maximum effectiveness. You can check  [employer] for these pads if needed.  -ARTHRITIS AND BURSITIS: Arthritis is inflammation of the joints, and bursitis is inflammation of the fluid-filled sacs that cushion the joints. Both conditions are contributing to your leg pain. We discussed switching your medication to Celecoxib to better manage your arthritis and reduce stomach issues.  -EDEMA OF FEET: Edema is swelling caused by fluid trapped in your body's tissues. This is contributing to your discomfort and difficulty finding suitable footwear.  INSTRUCTIONS: Please follow up with your primary care provider to discuss switching to Celecoxib for better management of your arthritis and to address your concerns about meloxicam. If your foot pain persists beyond six to eight  weeks, schedule a follow-up appointment for a potential second steroid injection.                      Contains text generated by [employer].                                 Contains text generated by [employer].  Plantar Fasciitis (Heel Spur Syndrome) with Rehab The plantar fascia is a fibrous, ligament-like, soft-tissue structure that spans the bottom of the foot. Plantar fasciitis is a condition that causes pain in the foot due to inflammation of the tissue. SYMPTOMS  Pain and tenderness on the underneath side of the foot. Pain that worsens with standing or walking. CAUSES  Plantar fasciitis is caused by irritation and injury to the plantar fascia on the underneath side of the foot. Common mechanisms of injury include: Direct trauma to bottom of the foot. Damage to a small nerve that runs under the foot where the main fascia attaches to the heel bone. Stress placed on the plantar fascia due to bone spurs. RISK INCREASES WITH:  Activities that place stress on the plantar fascia (running, jumping, pivoting, or cutting). Poor strength and flexibility. Improperly fitted shoes. Tight calf muscles. Flat feet. Failure to warm-up properly before activity. Obesity. PREVENTION Warm up and stretch properly before activity. Allow for adequate recovery between workouts. Maintain physical fitness: Strength, flexibility, and endurance. Cardiovascular fitness. Maintain a health body weight. Avoid stress on the plantar fascia. Wear properly fitted shoes, including arch supports for individuals who have flat feet.  PROGNOSIS  If treated properly, then the symptoms of plantar fasciitis usually resolve without surgery. However, occasionally surgery is necessary.  RELATED COMPLICATIONS  Recurrent symptoms that may result in a chronic condition. Problems of the lower back that are caused by compensating for the injury, such as limping. Pain or weakness  of the foot during push-off following surgery. Chronic inflammation, scarring, and partial or complete fascia tear, occurring more often from repeated injections.  TREATMENT  Treatment initially involves the use of ice and medication to help reduce pain and inflammation. The use of strengthening and stretching exercises may help reduce pain with activity, especially stretches of the Achilles tendon. These exercises may be performed at home or with a therapist. Your caregiver may recommend that you use heel cups of arch supports to help reduce stress on the plantar fascia. Occasionally, corticosteroid injections are given to reduce inflammation. If symptoms persist for greater than 6 months despite non-surgical (conservative), then surgery may be recommended.   MEDICATION  If pain medication is necessary, then nonsteroidal anti-inflammatory medications, such as aspirin and ibuprofen, or other minor pain relievers, such as acetaminophen, are often recommended. Do not take pain medication within 7 days  before surgery. Prescription pain relievers may be given if deemed necessary by your caregiver. Use only as directed and only as much as you need. Corticosteroid injections may be given by your caregiver. These injections should be reserved for the most serious cases, because they may only be given a certain number of times.  HEAT AND COLD Cold treatment (icing) relieves pain and reduces inflammation. Cold treatment should be applied for 10 to 15 minutes every 2 to 3 hours for inflammation and pain and immediately after any activity that aggravates your symptoms. Use ice packs or massage the area with a piece of ice (ice massage). Heat treatment may be used prior to performing the stretching and strengthening activities prescribed by your caregiver, physical therapist, or athletic trainer. Use a heat pack or soak the injury in warm water.  SEEK IMMEDIATE MEDICAL CARE IF: Treatment seems to offer no  benefit, or the condition worsens. Any medications produce adverse side effects.  EXERCISES- RANGE OF MOTION (ROM) AND STRETCHING EXERCISES - Plantar Fasciitis (Heel Spur Syndrome) These exercises may help you when beginning to rehabilitate your injury. Your symptoms may resolve with or without further involvement from your physician, physical therapist or athletic trainer. While completing these exercises, remember:  Restoring tissue flexibility helps normal motion to return to the joints. This allows healthier, less painful movement and activity. An effective stretch should be held for at least 30 seconds. A stretch should never be painful. You should only feel a gentle lengthening or release in the stretched tissue.  RANGE OF MOTION - Toe Extension, Flexion Sit with your right / left leg crossed over your opposite knee. Grasp your toes and gently pull them back toward the top of your foot. You should feel a stretch on the bottom of your toes and/or foot. Hold this stretch for 10 seconds. Now, gently pull your toes toward the bottom of your foot. You should feel a stretch on the top of your toes and or foot. Hold this stretch for 10 seconds. Repeat  times. Complete this stretch 3 times per day.   RANGE OF MOTION - Ankle Dorsiflexion, Active Assisted Remove shoes and sit on a chair that is preferably not on a carpeted surface. Place right / left foot under knee. Extend your opposite leg for support. Keeping your heel down, slide your right / left foot back toward the chair until you feel a stretch at your ankle or calf. If you do not feel a stretch, slide your bottom forward to the edge of the chair, while still keeping your heel down. Hold this stretch for 10 seconds. Repeat 3 times. Complete this stretch 2 times per day.   STRETCH  Gastroc, Standing Place hands on wall. Extend right / left leg, keeping the front knee somewhat bent. Slightly point your toes inward on your back  foot. Keeping your right / left heel on the floor and your knee straight, shift your weight toward the wall, not allowing your back to arch. You should feel a gentle stretch in the right / left calf. Hold this position for 10 seconds. Repeat 3 times. Complete this stretch 2 times per day.  STRETCH  Soleus, Standing Place hands on wall. Extend right / left leg, keeping the other knee somewhat bent. Slightly point your toes inward on your back foot. Keep your right / left heel on the floor, bend your back knee, and slightly shift your weight over the back leg so that you feel a gentle stretch deep  in your back calf. Hold this position for 10 seconds. Repeat 3 times. Complete this stretch 2 times per day.  STRETCH  Gastrocsoleus, Standing  Note: This exercise can place a lot of stress on your foot and ankle. Please complete this exercise only if specifically instructed by your caregiver.  Place the ball of your right / left foot on a step, keeping your other foot firmly on the same step. Hold on to the wall or a rail for balance. Slowly lift your other foot, allowing your body weight to press your heel down over the edge of the step. You should feel a stretch in your right / left calf. Hold this position for 10 seconds. Repeat this exercise with a slight bend in your right / left knee. Repeat 3 times. Complete this stretch 2 times per day.   STRENGTHENING EXERCISES - Plantar Fasciitis (Heel Spur Syndrome)  These exercises may help you when beginning to rehabilitate your injury. They may resolve your symptoms with or without further involvement from your physician, physical therapist or athletic trainer. While completing these exercises, remember:  Muscles can gain both the endurance and the strength needed for everyday activities through controlled exercises. Complete these exercises as instructed by your physician, physical therapist or athletic trainer. Progress the resistance and  repetitions only as guided.  STRENGTH - Towel Curls Sit in a chair positioned on a non-carpeted surface. Place your foot on a towel, keeping your heel on the floor. Pull the towel toward your heel by only curling your toes. Keep your heel on the floor. Repeat 3 times. Complete this exercise 2 times per day.  STRENGTH - Ankle Inversion Secure one end of a rubber exercise band/tubing to a fixed object (table, pole). Loop the other end around your foot just before your toes. Place your fists between your knees. This will focus your strengthening at your ankle. Slowly, pull your big toe up and in, making sure the band/tubing is positioned to resist the entire motion. Hold this position for 10 seconds. Have your muscles resist the band/tubing as it slowly pulls your foot back to the starting position. Repeat 3 times. Complete this exercises 2 times per day.  Document Released: 10/23/2005 Document Revised: 01/15/2012 Document Reviewed: 02/04/2009 [facility] Patient Information 2014 [place], [place].
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
## 2024-03-24 ENCOUNTER — Telehealth: Payer: Self-pay | Admitting: Podiatry

## 2024-03-24 NOTE — Telephone Encounter (Signed)
 Pt was seen 5/16 and salicylic acid to promote peeling was  applied. She wants to know: Is she suppose to pill that off 2. Will it fall of on it's own 3. Do she need to soak

## 2024-03-28 ENCOUNTER — Ambulatory Visit: Admitting: Podiatry

## 2024-04-08 NOTE — Progress Notes (Unsigned)
 04/08/2024 Sara Evans [id number] Oct 08, 1956   CHIEF COMPLAINT: Schedule a colonoscopy  HISTORY OF PRESENT ILLNESS: Sara Evans is a 68 year old female with a past medical history of anxiety, hypertension, hyperlipidemia, OSA (intolerant to CPAP), hypothyroidism, obesity, diverticulosis, possible colon polyps and GERD. She presents to our office today as referred by Dr. Ravisankar Avva for further evaluation regarding occult blood in stool and to schedule a colonoscopy.  She believes she possibly had 1 or 2 colon polyps removed per colonoscopy done in the past, further details are unclear.  Her most recent colonoscopy by Dr. Wonda Hay 11/02/2014 was normal.  Colonoscopy 09/26/2004 was normal. Sister with history of colon polyps. No known family history of colon cancer.  She has chronic constipation for which she takes 2 softeners at bedtime and MiraLAX as needed which resulted in passing a normal brown formed stool most days.  No obvious bright red blood per the rectum.  No melena.  She started taking Meloxicam daily for 2 weeks 02/2024 due to having arthritis and bursitis pain.  She developed mild reflux symptoms for few days therefore she stopped taking meloxicam on a daily basis and her reflux symptoms abated.  She took famotidine OTC x 1.  She is not taking Meloxicam once weekly.  On ASA 81 mg daily.  She has a prior history of GERD and underwent an EGD more than 10 years ago which she reported showed a hiatal hernia.  Prior to taking Meloxicam, she had not experienced any reflux symptoms for many years.  No dysphagia.  Labs 02/08/2024: FOBT positive.  TSH 4.970. Labs 01/30/2024: WBC 7.0.  Hemoglobin 14.7.  Hematocrit 43.0.  MCV 92.6.  Platelet 201.  PAST GI PROCEDURES:  Colonoscopy 11/02/2014 by Dr. Wonda Hay:  Normal colonoscopy   Colonoscopy 09/26/2004: Normal  Past Medical History:  Diagnosis Date   Anemia    no current iron use   Anxiety    Arrhythmia    Arthritis     GERD (gastroesophageal reflux disease)    Hyperlipidemia    Hypertension    Hypothyroidism    OSA (obstructive sleep apnea)    could not tolerate cpap   Past Surgical History:  Procedure Laterality Date   ABDOMINAL HYSTERECTOMY  Nov 03, 1992   complete   COLONOSCOPY WITH PROPOFOL  N/A 11/02/2014   Procedure: COLONOSCOPY WITH PROPOFOL ;  Surgeon: Garrett Kallman, MD;  Location: [facility];  Service: Endoscopy;  Laterality: N/A;   colonscopy  age 42 and age 5   x 2   Social History: She is married.  She has 1 son.  Her daughter is deceased.  Past smoker, quit smoking cigarettes 48 years ago.  No alcohol  use.  No drug use.  Family History: Sister with history of colon polyps.  Mother with heart disease.  Father had lung cancer.  No known family history of colon cancer.  Allergies  Allergen Reactions   Bupropion Hcl     REACTION: itching   Hydrochlorothiazide-Triamterene     REACTION: GI upset   Sulfonamide Derivatives     REACTION: rash     Outpatient Encounter Medications as of 04/09/2024  Medication Sig   ALPRAZolam (XANAX) 0.5 MG tablet Take 0.5 mg by mouth at bedtime as needed for anxiety.   aspirin 81 MG tablet Take 81 mg by mouth at bedtime.    atorvastatin (LIPITOR) 10 MG tablet Take 10 mg by mouth every evening.    Calcium Carb-Cholecalciferol (CALCIUM 600 +  D) 600-200 MG-UNIT TABS Take 1 tablet by mouth 2 (two) times daily.    Cholecalciferol (D3-1000 PO) Take 1 capsule by mouth every morning.    Docusate Calcium (STOOL SOFTENER PO) Take 2 tablets by mouth at bedtime.    levothyroxine (SYNTHROID, LEVOTHROID) 75 MCG tablet Take 75 mcg by mouth daily before breakfast.   Multiple Vitamin (MULTIVITAMIN) capsule Take 1 capsule by mouth every morning.    naproxen sodium (ANAPROX) 220 MG tablet Take 440 mg by mouth daily as needed (pain).    nebivolol (BYSTOLIC) 10 MG tablet Take 20 mg by mouth every morning.   Olmesartan-Amlodipine-HCTZ 40-5-25 MG TABS Take 1 tablet by  mouth every morning.   ranitidine (ZANTAC) 75 MG tablet Take 75 mg by mouth 2 (two) times daily as needed for heartburn.    No facility-administered encounter medications on file as of 04/09/2024.   REVIEW OF SYSTEMS:  Gen: + Fatigue. Denies fever, sweats or chills. No weight loss.  CV: Denies chest pain, palpitations or edema. Resp: Denies cough, shortness of breath of hemoptysis.  GI:See HPI. GU: + Urine leakage. MS:+ Arthritis, bursitis.  Derm: Denies rash, itchiness, skin lesions or unhealing ulcers. Psych: Denies depression, anxiety, memory loss or confusion. Heme: Denies bruising, easy bleeding. Neuro:  + Vertigo.  Endo:  Denies any problems with DM, thyroid  or adrenal function.  PHYSICAL EXAM:  BP 138/82 (BP Location: Left Arm, Patient Position: Sitting, Cuff Size: Large)   Pulse 72   Ht 5' 2.25" (1.581 m) Comment: height measured without shoes  Wt 294 lb 6 oz (133.5 kg)   BMI 53.41 kg/m   Wt Readings from Last 3 Encounters:  04/09/24 294 lb 6 oz (133.5 kg)  03/20/24 265 lb (120.2 kg)  11/02/14 265 lb (120.2 kg)    General: 68 year old female in no acute distress. Head: Normocephalic and atraumatic. Eyes:  Sclerae non-icteric, conjunctive pink. Ears: Normal auditory acuity. Mouth: Dentition intact. No ulcers or lesions.  Neck: Supple, no lymphadenopathy or thyromegaly.  Lungs: Clear bilaterally to auscultation without wheezes, crackles or rhonchi. Heart: Regular rate and rhythm. No murmur, rub or gallop appreciated.  Abdomen: Soft, obese abdomen. Nontender, nondistended. No masses. No hepatosplenomegaly. Normoactive bowel sounds x 4 quadrants.  Rectal: Deferred.  Musculoskeletal: Symmetrical with no gross deformities. Skin: Warm and dry. No rash or lesions on visible extremities. Extremities: Mild bilateral lower extremity edema. Neurological: Alert oriented x 4, no focal deficits.  Psychological: Alert and cooperative. Normal mood and affect.  ASSESSMENT AND  PLAN:  68 year old female with possible remote history of colon polyps with positive FOBT.  Sister with history of colon polyps.  Normal colonoscopy 09/2004 and 10/2014. - Colonoscopy at [facility] ( BMI > 50) benefits and risks discussed including risk with sedation, risk of bleeding, perforation and infection  - Patient instructed to take MiraLAX nightly for 1 week prior to colonoscopy prep date - Further recommendations to be determined after colonoscopy completed  Chronic constipation - MiraLAX nightly as needed  GERD, recent flare for few days after she took Meloxicam x 2 consecutive weeks.  Reflux symptoms abated after she stopped taking meloxicam daily, currently taking once weekly. - Famotidine 20 mg 1 p.o. daily as needed - GERD diet - Patient to contact office if GERD symptoms recur       CC:  Avva, Ravisankar, MD

## 2024-04-09 ENCOUNTER — Encounter: Payer: Self-pay | Admitting: Nurse Practitioner

## 2024-04-09 ENCOUNTER — Ambulatory Visit: Admitting: Nurse Practitioner

## 2024-04-09 VITALS — BP 138/82 | HR 72 | Ht 62.25 in | Wt 294.4 lb

## 2024-04-09 DIAGNOSIS — K219 Gastro-esophageal reflux disease without esophagitis: Secondary | ICD-10-CM | POA: Diagnosis not present

## 2024-04-09 DIAGNOSIS — R195 Other fecal abnormalities: Secondary | ICD-10-CM | POA: Diagnosis not present

## 2024-04-09 DIAGNOSIS — Z860100 Personal history of colon polyps, unspecified: Secondary | ICD-10-CM

## 2024-04-09 DIAGNOSIS — Z83719 Family history of colon polyps, unspecified: Secondary | ICD-10-CM

## 2024-04-09 DIAGNOSIS — K5909 Other constipation: Secondary | ICD-10-CM | POA: Diagnosis not present

## 2024-04-09 MED ORDER — NA SULFATE-K SULFATE-MG SULF 17.5-3.13-1.6 GM/177ML PO SOLN: 1.0000 | Freq: Once | ORAL | 0 refills | Status: AC

## 2024-04-09 NOTE — Progress Notes (Signed)
 Noted

## 2024-04-09 NOTE — Patient Instructions (Addendum)
 Please purchase the following medications over the counter and take as directed: Miralax every night at bedtime starting Wednesday 06/11/24.  You have been scheduled for a colonoscopy. Please follow written instructions given to you at your visit today.   If you use inhalers (even only as needed), please bring them with you on the day of your procedure.  DO NOT TAKE 7 DAYS PRIOR TO TEST- Trulicity (dulaglutide) Ozempic, Wegovy (semaglutide) Mounjaro (tirzepatide) Bydureon Bcise (exanatide extended release)  DO NOT TAKE 1 DAY PRIOR TO YOUR TEST Rybelsus (semaglutide) Adlyxin (lixisenatide) Victoza (liraglutide) Byetta (exanatide)  Thank you for trusting me with your gastrointestinal care!   Everett Hitt, NP ___________________________________________________________________________  Please follow up sooner if symptoms increase or worsen   Due to recent changes in healthcare laws, you may see the results of your imaging and laboratory studies on MyChart before your provider has had a chance to review them.  We understand that in some cases there may be results that are confusing or concerning to you. Not all laboratory results come back in the same time frame and the provider may be waiting for multiple results in order to interpret others.  Please give us  48 hours in order for your provider to thoroughly review all the results before contacting the office for clarification of your results.   _______________________________________________________  If your blood pressure at your visit was 140/90 or greater, please contact your primary care physician to follow up on this.  _______________________________________________________  If you are age 70 or older, your body mass index should be between 23-30. Your Body mass index is 53.41 kg/m. If this is out of the aforementioned range listed, please consider follow up with your Primary Care Provider.  If you are age 70 or younger,  your body mass index should be between 19-25. Your Body mass index is 53.41 kg/m. If this is out of the aformentioned range listed, please consider follow up with your Primary Care Provider.   ________________________________________________________  The [facility] providers would like to encourage you to use MYCHART to communicate with providers for non-urgent requests or questions.  Due to long hold times on the telephone, sending your provider a message by [facility] may be a faster and more efficient way to get a response.  Please allow 48 business hours for a response.  Please remember that this is for non-urgent requests.  _______________________________________________________

## 2024-06-10 ENCOUNTER — Encounter (HOSPITAL_COMMUNITY): Payer: Self-pay | Admitting: Internal Medicine

## 2024-06-10 ENCOUNTER — Other Ambulatory Visit: Payer: Self-pay

## 2024-06-12 ENCOUNTER — Telehealth: Payer: Self-pay

## 2024-06-12 NOTE — Telephone Encounter (Signed)
 Procedure:COLON Procedure date: 06/18/2024 Procedure location: [facility] Arrival Time: 8:00 Spoke with the patient Y/N: Y Any prep concerns? N  Has the patient obtained the prep from the pharmacy ? Y Do you have a care partner and transportation: Y Any additional concerns? N

## 2024-06-18 ENCOUNTER — Ambulatory Visit (HOSPITAL_COMMUNITY): Admitting: Anesthesiology

## 2024-06-18 ENCOUNTER — Encounter (HOSPITAL_COMMUNITY)
Admission: RE | Disposition: A | Discharge status: Home / Self Care | Payer: Self-pay | Source: Home / Self Care | Attending: Internal Medicine

## 2024-06-18 ENCOUNTER — Ambulatory Visit (HOSPITAL_BASED_OUTPATIENT_CLINIC_OR_DEPARTMENT_OTHER): Admitting: Anesthesiology

## 2024-06-18 ENCOUNTER — Encounter (HOSPITAL_COMMUNITY): Payer: Self-pay | Admitting: Internal Medicine

## 2024-06-18 ENCOUNTER — Ambulatory Visit (HOSPITAL_COMMUNITY)
Admission: RE | Admit: 2024-06-18 | Discharge: 2024-06-18 | Disposition: A | Discharge status: Home / Self Care | Attending: Internal Medicine | Admitting: Internal Medicine

## 2024-06-18 ENCOUNTER — Other Ambulatory Visit: Payer: Self-pay

## 2024-06-18 DIAGNOSIS — E6689 Other obesity not elsewhere classified: Secondary | ICD-10-CM | POA: Diagnosis not present

## 2024-06-18 DIAGNOSIS — K5909 Other constipation: Secondary | ICD-10-CM | POA: Diagnosis not present

## 2024-06-18 DIAGNOSIS — Z860100 Personal history of colon polyps, unspecified: Secondary | ICD-10-CM | POA: Diagnosis not present

## 2024-06-18 DIAGNOSIS — K5730 Diverticulosis of large intestine without perforation or abscess without bleeding: Secondary | ICD-10-CM | POA: Diagnosis not present

## 2024-06-18 DIAGNOSIS — G4733 Obstructive sleep apnea (adult) (pediatric): Secondary | ICD-10-CM | POA: Diagnosis not present

## 2024-06-18 DIAGNOSIS — R195 Other fecal abnormalities: Secondary | ICD-10-CM | POA: Diagnosis not present

## 2024-06-18 DIAGNOSIS — Z6841 Body Mass Index (BMI) 40.0 and over, adult: Secondary | ICD-10-CM | POA: Diagnosis not present

## 2024-06-18 DIAGNOSIS — K449 Diaphragmatic hernia without obstruction or gangrene: Secondary | ICD-10-CM | POA: Diagnosis not present

## 2024-06-18 DIAGNOSIS — Z83719 Family history of colon polyps, unspecified: Secondary | ICD-10-CM | POA: Insufficient documentation

## 2024-06-18 DIAGNOSIS — E785 Hyperlipidemia, unspecified: Secondary | ICD-10-CM | POA: Insufficient documentation

## 2024-06-18 DIAGNOSIS — F419 Anxiety disorder, unspecified: Secondary | ICD-10-CM | POA: Diagnosis not present

## 2024-06-18 DIAGNOSIS — M1990 Unspecified osteoarthritis, unspecified site: Secondary | ICD-10-CM | POA: Insufficient documentation

## 2024-06-18 DIAGNOSIS — E039 Hypothyroidism, unspecified: Secondary | ICD-10-CM | POA: Insufficient documentation

## 2024-06-18 DIAGNOSIS — Z1211 Encounter for screening for malignant neoplasm of colon: Secondary | ICD-10-CM

## 2024-06-18 DIAGNOSIS — K921 Melena: Secondary | ICD-10-CM | POA: Insufficient documentation

## 2024-06-18 DIAGNOSIS — Z87891 Personal history of nicotine dependence: Secondary | ICD-10-CM | POA: Insufficient documentation

## 2024-06-18 DIAGNOSIS — Z7982 Long term (current) use of aspirin: Secondary | ICD-10-CM | POA: Insufficient documentation

## 2024-06-18 DIAGNOSIS — K219 Gastro-esophageal reflux disease without esophagitis: Secondary | ICD-10-CM | POA: Diagnosis not present

## 2024-06-18 DIAGNOSIS — I10 Essential (primary) hypertension: Secondary | ICD-10-CM | POA: Insufficient documentation

## 2024-06-18 HISTORY — PX: COLONOSCOPY: SHX5424

## 2024-06-18 SURGERY — COLONOSCOPY
Anesthesia: Monitor Anesthesia Care

## 2024-06-18 MED ORDER — SODIUM CHLORIDE 0.9 % IV SOLN: INTRAVENOUS | Status: DC

## 2024-06-18 MED ORDER — LIDOCAINE HCL (CARDIAC) PF 100 MG/5ML IV SOSY
PREFILLED_SYRINGE | INTRAVENOUS | Status: DC | PRN
Start: 2024-06-18 — End: 2024-06-18
  Administered 2024-06-18 (×2): 60 mg via INTRAVENOUS

## 2024-06-18 MED ORDER — PROPOFOL 500 MG/50ML IV EMUL
INTRAVENOUS | Status: DC | PRN
Start: 2024-06-18 — End: 2024-06-18
  Administered 2024-06-18 (×2): 40 mg via INTRAVENOUS
  Administered 2024-06-18 (×2): 135 ug/kg/min via INTRAVENOUS

## 2024-06-18 MED ORDER — ONDANSETRON HCL 4 MG/2ML IJ SOLN
INTRAMUSCULAR | Status: DC | PRN
  Administered 2024-06-18 (×2): 4 mg via INTRAVENOUS

## 2024-06-18 NOTE — H&P (Signed)
 Expand All Collapse All        04/08/2024 Sara Evans [id number] 1956/10/18     CHIEF COMPLAINT: Schedule a colonoscopy   HISTORY OF PRESENT ILLNESS: Sara Evans is a 68 year old female with a past medical history of anxiety, hypertension, hyperlipidemia, OSA (intolerant to CPAP), hypothyroidism, obesity, diverticulosis, possible colon polyps and GERD. She presents to our office today as referred by Dr. Ravisankar Avva for further evaluation regarding occult blood in stool and to schedule a colonoscopy.  She believes she possibly had 1 or 2 colon polyps removed per colonoscopy done in the past, further details are unclear.  Her most recent colonoscopy by Dr. Gladis Louder 11/02/2014 was normal.  Colonoscopy 09/26/2004 was normal. Sister with history of colon polyps. No known family history of colon cancer.  She has chronic constipation for which she takes 2 softeners at bedtime and MiraLAX as needed which resulted in passing a normal brown formed stool most days.  No obvious bright red blood per the rectum.  No melena.  She started taking Meloxicam daily for 2 weeks 02/2024 due to having arthritis and bursitis pain.  She developed mild reflux symptoms for few days therefore she stopped taking meloxicam on a daily basis and her reflux symptoms abated.  She took famotidine OTC x 1.  She is not taking Meloxicam once weekly.  On ASA 81 mg daily.  She has a prior history of GERD and underwent an EGD more than 10 years ago which she reported showed a hiatal hernia.  Prior to taking Meloxicam, she had not experienced any reflux symptoms for many years.  No dysphagia.   Labs 02/08/2024: FOBT positive.  TSH 4.970. Labs 01/30/2024: WBC 7.0.  Hemoglobin 14.7.  Hematocrit 43.0.  MCV 92.6.  Platelet 201.   PAST GI PROCEDURES:   Colonoscopy 11/02/2014 by Dr. Gladis Louder:  Normal colonoscopy    Colonoscopy 09/26/2004: Normal       Past Medical History:  Diagnosis Date   Anemia      no current iron  use   Anxiety     Arrhythmia     Arthritis     GERD (gastroesophageal reflux disease)     Hyperlipidemia     Hypertension     Hypothyroidism     OSA (obstructive sleep apnea)      could not tolerate cpap             Past Surgical History:  Procedure Laterality Date   ABDOMINAL HYSTERECTOMY   Nov 03, 1992    complete   COLONOSCOPY WITH PROPOFOL  N/A 11/02/2014    Procedure: COLONOSCOPY WITH PROPOFOL ;  Surgeon: Gladis MARLA Louder, MD;  Location: [facility];  Service: Endoscopy;  Laterality: N/A;   colonscopy   age 74 and age 89    x 2        Social History: She is married.  She has 1 son.  Her daughter is deceased.  Past smoker, quit smoking cigarettes 48 years ago.  No alcohol  use.  No drug use.   Family History: Sister with history of colon polyps.  Mother with heart disease.  Father had lung cancer.  No known family history of colon cancer.   Allergies       Allergies  Allergen Reactions   Bupropion Hcl        REACTION: itching   Hydrochlorothiazide-Triamterene        REACTION: GI upset   Sulfonamide Derivatives  REACTION: rash            Outpatient Encounter Medications as of 04/09/2024  Medication Sig   ALPRAZolam (XANAX) 0.5 MG tablet Take 0.5 mg by mouth at bedtime as needed for anxiety.   aspirin 81 MG tablet Take 81 mg by mouth at bedtime.    atorvastatin (LIPITOR) 10 MG tablet Take 10 mg by mouth every evening.    Calcium Carb-Cholecalciferol (CALCIUM 600 + D) 600-200 MG-UNIT TABS Take 1 tablet by mouth 2 (two) times daily.    Cholecalciferol (D3-1000 PO) Take 1 capsule by mouth every morning.    Docusate Calcium (STOOL SOFTENER PO) Take 2 tablets by mouth at bedtime.    levothyroxine (SYNTHROID, LEVOTHROID) 75 MCG tablet Take 75 mcg by mouth daily before breakfast.   Multiple Vitamin (MULTIVITAMIN) capsule Take 1 capsule by mouth every morning.    naproxen sodium (ANAPROX) 220 MG tablet Take 440 mg by mouth daily as needed (pain).    nebivolol  (BYSTOLIC) 10 MG tablet Take 20 mg by mouth every morning.   Olmesartan-Amlodipine-HCTZ 40-5-25 MG TABS Take 1 tablet by mouth every morning.   ranitidine (ZANTAC) 75 MG tablet Take 75 mg by mouth 2 (two) times daily as needed for heartburn.       No facility-administered encounter medications on file as of 04/09/2024.      REVIEW OF SYSTEMS:  Gen: + Fatigue. Denies fever, sweats or chills. No weight loss.  CV: Denies chest pain, palpitations or edema. Resp: Denies cough, shortness of breath of hemoptysis.  GI:See HPI. GU: + Urine leakage. MS:+ Arthritis, bursitis.  Derm: Denies rash, itchiness, skin lesions or unhealing ulcers. Psych: Denies depression, anxiety, memory loss or confusion. Heme: Denies bruising, easy bleeding. Neuro:  + Vertigo.  Endo:  Denies any problems with DM, thyroid  or adrenal function.   PHYSICAL EXAM:   BP 138/82 (BP Location: Left Arm, Patient Position: Sitting, Cuff Size: Large)   Pulse 72   Ht 5' 2.25 (1.581 m) Comment: height measured without shoes  Wt 294 lb 6 oz (133.5 kg)   BMI 53.41 kg/m       Wt Readings from Last 3 Encounters:  04/09/24 294 lb 6 oz (133.5 kg)  03/20/24 265 lb (120.2 kg)  11/02/14 265 lb (120.2 kg)    General: 68 year old female in no acute distress. Head: Normocephalic and atraumatic. Eyes:  Sclerae non-icteric, conjunctive pink. Ears: Normal auditory acuity. Mouth: Dentition intact. No ulcers or lesions.  Neck: Supple, no lymphadenopathy or thyromegaly.  Lungs: Clear bilaterally to auscultation without wheezes, crackles or rhonchi. Heart: Regular rate and rhythm. No murmur, rub or gallop appreciated.  Abdomen: Soft, obese abdomen. Nontender, nondistended. No masses. No hepatosplenomegaly. Normoactive bowel sounds x 4 quadrants.  Rectal: Deferred.  Musculoskeletal: Symmetrical with no gross deformities. Skin: Warm and dry. No rash or lesions on visible extremities. Extremities: Mild bilateral lower extremity  edema. Neurological: Alert oriented x 4, no focal deficits.  Psychological: Alert and cooperative. Normal mood and affect.   ASSESSMENT AND PLAN:   68 year old female with possible remote history of colon polyps with positive FOBT.  Sister with history of colon polyps.  Normal colonoscopy 09/2004 and 10/2014. - Colonoscopy at [facility] ( BMI > 50) benefits and risks discussed including risk with sedation, risk of bleeding, perforation and infection  - Patient instructed to take MiraLAX nightly for 1 week prior to colonoscopy prep date - Further recommendations to be determined after colonoscopy completed   Chronic constipation - MiraLAX  nightly as needed   GERD, recent flare for few days after she took Meloxicam x 2 consecutive weeks.  Reflux symptoms abated after she stopped taking meloxicam daily, currently taking once weekly. - Famotidine 20 mg 1 p.o. daily as needed - GERD diet - Patient to contact office if GERD symptoms recur            CC:  Avva, Ravisankar, MD      Recent H&P as above.  No interval clinical change or change in physical exam.  Now for colonoscopy to evaluate heme positive stool.  Patient is high risk given her BMI.

## 2024-06-18 NOTE — Anesthesia Postprocedure Evaluation (Signed)
 Anesthesia Post Note  Patient: Sara Evans  Procedure(s) Performed: COLONOSCOPY     Patient location during evaluation: PACU Anesthesia Type: MAC Level of consciousness: awake and alert Pain management: pain level controlled Vital Signs Assessment: post-procedure vital signs reviewed and stable Respiratory status: spontaneous breathing, nonlabored ventilation, respiratory function stable and patient connected to nasal cannula oxygen Cardiovascular status: stable and blood pressure returned to baseline Postop Assessment: no apparent nausea or vomiting Anesthetic complications: no   No notable events documented.  Last Vitals:  Vitals:   06/18/24 1100 06/18/24 1105  BP: 122/81 (!) 144/60  Pulse: (!) 59 (!) 59  Resp: 16 16  Temp:    SpO2: 96% 95%    Last Pain:  Vitals:   06/18/24 1100  TempSrc:   PainSc: 0-No pain                 Rollande Thursby L Shanik [place]

## 2024-06-18 NOTE — H&P (Signed)
 Expand All Collapse All        04/08/2024 Sara Evans [id number] 1956-08-18     CHIEF COMPLAINT: Schedule a colonoscopy   HISTORY OF PRESENT ILLNESS: Sara Evans is a 68 year old female with a past medical history of anxiety, hypertension, hyperlipidemia, OSA (intolerant to CPAP), hypothyroidism, obesity, diverticulosis, possible colon polyps and GERD. She presents to our office today as referred by Dr. Ravisankar Avva for further evaluation regarding occult blood in stool and to schedule a colonoscopy.  She believes she possibly had 1 or 2 colon polyps removed per colonoscopy done in the past, further details are unclear.  Her most recent colonoscopy by Dr. Gladis Louder 11/02/2014 was normal.  Colonoscopy 09/26/2004 was normal. Sister with history of colon polyps. No known family history of colon cancer.  She has chronic constipation for which she takes 2 softeners at bedtime and MiraLAX as needed which resulted in passing a normal brown formed stool most days.  No obvious bright red blood per the rectum.  No melena.  She started taking Meloxicam daily for 2 weeks 02/2024 due to having arthritis and bursitis pain.  She developed mild reflux symptoms for few days therefore she stopped taking meloxicam on a daily basis and her reflux symptoms abated.  She took famotidine OTC x 1.  She is not taking Meloxicam once weekly.  On ASA 81 mg daily.  She has a prior history of GERD and underwent an EGD more than 10 years ago which she reported showed a hiatal hernia.  Prior to taking Meloxicam, she had not experienced any reflux symptoms for many years.  No dysphagia.   Labs 02/08/2024: FOBT positive.  TSH 4.970. Labs 01/30/2024: WBC 7.0.  Hemoglobin 14.7.  Hematocrit 43.0.  MCV 92.6.  Platelet 201.   PAST GI PROCEDURES:   Colonoscopy 11/02/2014 by Dr. Gladis Louder:  Normal colonoscopy    Colonoscopy 09/26/2004: Normal       Past Medical History:  Diagnosis Date   Anemia      no current iron  use   Anxiety     Arrhythmia     Arthritis     GERD (gastroesophageal reflux disease)     Hyperlipidemia     Hypertension     Hypothyroidism     OSA (obstructive sleep apnea)      could not tolerate cpap             Past Surgical History:  Procedure Laterality Date   ABDOMINAL HYSTERECTOMY   Nov 03, 1992    complete   COLONOSCOPY WITH PROPOFOL  N/A 11/02/2014    Procedure: COLONOSCOPY WITH PROPOFOL ;  Surgeon: Gladis MARLA Louder, MD;  Location: [facility];  Service: Endoscopy;  Laterality: N/A;   colonscopy   age 26 and age 55    x 2        Social History: She is married.  She has 1 son.  Her daughter is deceased.  Past smoker, quit smoking cigarettes 48 years ago.  No alcohol  use.  No drug use.   Family History: Sister with history of colon polyps.  Mother with heart disease.  Father had lung cancer.  No known family history of colon cancer.   Allergies       Allergies  Allergen Reactions   Bupropion Hcl        REACTION: itching   Hydrochlorothiazide-Triamterene        REACTION: GI upset   Sulfonamide Derivatives  REACTION: rash            Outpatient Encounter Medications as of 04/09/2024  Medication Sig   ALPRAZolam (XANAX) 0.5 MG tablet Take 0.5 mg by mouth at bedtime as needed for anxiety.   aspirin 81 MG tablet Take 81 mg by mouth at bedtime.    atorvastatin (LIPITOR) 10 MG tablet Take 10 mg by mouth every evening.    Calcium Carb-Cholecalciferol (CALCIUM 600 + D) 600-200 MG-UNIT TABS Take 1 tablet by mouth 2 (two) times daily.    Cholecalciferol (D3-1000 PO) Take 1 capsule by mouth every morning.    Docusate Calcium (STOOL SOFTENER PO) Take 2 tablets by mouth at bedtime.    levothyroxine (SYNTHROID, LEVOTHROID) 75 MCG tablet Take 75 mcg by mouth daily before breakfast.   Multiple Vitamin (MULTIVITAMIN) capsule Take 1 capsule by mouth every morning.    naproxen sodium (ANAPROX) 220 MG tablet Take 440 mg by mouth daily as needed (pain).    nebivolol  (BYSTOLIC) 10 MG tablet Take 20 mg by mouth every morning.   Olmesartan-Amlodipine-HCTZ 40-5-25 MG TABS Take 1 tablet by mouth every morning.   ranitidine (ZANTAC) 75 MG tablet Take 75 mg by mouth 2 (two) times daily as needed for heartburn.       No facility-administered encounter medications on file as of 04/09/2024.      REVIEW OF SYSTEMS:  Gen: + Fatigue. Denies fever, sweats or chills. No weight loss.  CV: Denies chest pain, palpitations or edema. Resp: Denies cough, shortness of breath of hemoptysis.  GI:See HPI. GU: + Urine leakage. MS:+ Arthritis, bursitis.  Derm: Denies rash, itchiness, skin lesions or unhealing ulcers. Psych: Denies depression, anxiety, memory loss or confusion. Heme: Denies bruising, easy bleeding. Neuro:  + Vertigo.  Endo:  Denies any problems with DM, thyroid  or adrenal function.   PHYSICAL EXAM:   BP 138/82 (BP Location: Left Arm, Patient Position: Sitting, Cuff Size: Large)   Pulse 72   Ht 5' 2.25 (1.581 m) Comment: height measured without shoes  Wt 294 lb 6 oz (133.5 kg)   BMI 53.41 kg/m       Wt Readings from Last 3 Encounters:  04/09/24 294 lb 6 oz (133.5 kg)  03/20/24 265 lb (120.2 kg)  11/02/14 265 lb (120.2 kg)    General: 68 year old female in no acute distress. Head: Normocephalic and atraumatic. Eyes:  Sclerae non-icteric, conjunctive pink. Ears: Normal auditory acuity. Mouth: Dentition intact. No ulcers or lesions.  Neck: Supple, no lymphadenopathy or thyromegaly.  Lungs: Clear bilaterally to auscultation without wheezes, crackles or rhonchi. Heart: Regular rate and rhythm. No murmur, rub or gallop appreciated.  Abdomen: Soft, obese abdomen. Nontender, nondistended. No masses. No hepatosplenomegaly. Normoactive bowel sounds x 4 quadrants.  Rectal: Deferred.  Musculoskeletal: Symmetrical with no gross deformities. Skin: Warm and dry. No rash or lesions on visible extremities. Extremities: Mild bilateral lower extremity  edema. Neurological: Alert oriented x 4, no focal deficits.  Psychological: Alert and cooperative. Normal mood and affect.   ASSESSMENT AND PLAN:   68 year old female with possible remote history of colon polyps with positive FOBT.  Sister with history of colon polyps.  Normal colonoscopy 09/2004 and 10/2014. - Colonoscopy at [facility] ( BMI > 50) benefits and risks discussed including risk with sedation, risk of bleeding, perforation and infection  - Patient instructed to take MiraLAX nightly for 1 week prior to colonoscopy prep date - Further recommendations to be determined after colonoscopy completed   Chronic constipation - MiraLAX  nightly as needed   GERD, recent flare for few days after she took Meloxicam x 2 consecutive weeks.  Reflux symptoms abated after she stopped taking meloxicam daily, currently taking once weekly. - Famotidine 20 mg 1 p.o. daily as needed - GERD diet - Patient to contact office if GERD symptoms recur            CC:  Avva, Ravisankar, MD      Recent H&P as above.  No interval clinical change or change in physical exam.  Now for colonoscopy to evaluate heme positive stool.  Patient is high risk given her BMI.

## 2024-06-18 NOTE — Op Note (Signed)
 [facility] Patient Name: Sara Evans Procedure Date: 06/18/2024 MRN: [id number] Attending MD: Norleen SAILOR. Abran , MD, [id number] Date of Birth: 1956-09-02 CSN: [id number] Age: 68 Admit Type: Ambulatory Procedure:                Colonoscopy Indications:              Heme positive stool Providers:                Norleen SAILOR. Abran, MD, Hoy Penner, RN, [place]y [place], Technician Referring MD:             Dr. FABIENE Overcast, MD Medicines:                Monitored Anesthesia Care Complications:            No immediate complications. Estimated blood loss:                            None. Estimated Blood Loss:     Estimated blood loss: none. Procedure:                Pre-Anesthesia Assessment:                           - Prior to the procedure, a History and Physical                            was performed, and patient medications and                            allergies were reviewed. The patient's tolerance of                            previous anesthesia was also reviewed. The risks                            and benefits of the procedure and the sedation                            options and risks were discussed with the patient.                            All questions were answered, and informed consent                            was obtained. Prior Anticoagulants: The patient has                            taken no anticoagulant or antiplatelet agents. ASA                            Grade Assessment: III - A patient with severe  systemic disease. After reviewing the risks and                            benefits, the patient was deemed in satisfactory                            condition to undergo the procedure.                           After obtaining informed consent, the colonoscope                            was passed under direct vision. Throughout the                            procedure, the patient's blood  pressure, pulse, and                            oxygen saturations were monitored continuously. The                            CF-HQ190L ([id number]) Olympus colonoscope was                            introduced through the anus and advanced to the the                            cecum, identified by appendiceal orifice and                            ileocecal valve. The ileocecal valve, appendiceal                            orifice, and rectum were photographed. The quality                            of the bowel preparation was excellent. The                            colonoscopy was performed without difficulty. The                            patient tolerated the procedure well. The bowel                            preparation used was SUPREP via split dose                            instruction. Scope In: 10:23:52 AM Scope Out: 10:36:18 AM Scope Withdrawal Time: 0 hours 8 minutes 2 seconds  Total Procedure Duration: 0 hours 12 minutes 26 seconds  Findings:      Multiple diverticula were found in the sigmoid colon.      The exam was otherwise without abnormality on direct and retroflexion  views. Impression:               - Diverticulosis in the sigmoid colon.                           - The examination was otherwise normal on direct                            and retroflexion views.                           - No specimens collected. Moderate Sedation:      none Recommendation:           - Repeat colonoscopy is not recommended for                            screening purposes.                           - Patient has a contact number available for                            emergencies. The signs and symptoms of potential                            delayed complications were discussed with the                            patient. Return to normal activities tomorrow.                            Written discharge instructions were provided to the                             patient.                           - Resume previous diet.                           - Continue present medications. Procedure Code(s):        --- Professional ---                           [phone], Colonoscopy, flexible; diagnostic, including                            collection of specimen(s) by brushing or washing,                            when performed (separate procedure) Diagnosis Code(s):        --- Professional ---                           R19.5, Other fecal abnormalities  K57.30, Diverticulosis of large intestine without                            perforation or abscess without bleeding CPT copyright 2022 [employer]. All rights reserved. The codes documented in this report are preliminary and upon coder review may  be revised to meet current compliance requirements. Norleen SAILOR. Abran, MD 06/18/2024 10:46:08 AM This report has been signed electronically. Number of Addenda: 0

## 2024-06-18 NOTE — Anesthesia Preprocedure Evaluation (Addendum)
 Anesthesia Evaluation  Patient identified by MRN, date of birth, ID band Patient awake    Reviewed: Allergy & Precautions, NPO status , Patient's Chart, lab work & pertinent test results, reviewed documented beta blocker date and time   Airway Mallampati: I  TM Distance: >3 FB Neck ROM: Full    Dental no notable dental hx. (+) Teeth Intact, Dental Advisory Given   Pulmonary sleep apnea (noncompliant with CPAP) , former smoker   Pulmonary exam normal breath sounds clear to auscultation       Cardiovascular hypertension, Pt. on medications and Pt. on home beta blockers Normal cardiovascular exam Rhythm:Regular Rate:Normal     Neuro/Psych  PSYCHIATRIC DISORDERS Anxiety     negative neurological ROS     GI/Hepatic Neg liver ROS,GERD  ,,  Endo/Other  Hypothyroidism  Class 4 obesity (54)  Renal/GU negative Renal ROS  negative genitourinary   Musculoskeletal  (+) Arthritis ,    Abdominal   Peds  Hematology negative hematology ROS (+)   Anesthesia Other Findings   Reproductive/Obstetrics                              Anesthesia Physical Anesthesia Plan  ASA: 3  Anesthesia Plan: MAC   Post-op Pain Management:    Induction: Intravenous  PONV Risk Score and Plan: Propofol  infusion and Treatment may vary due to age or medical condition  Airway Management Planned: Natural Airway  Additional Equipment:   Intra-op Plan:   Post-operative Plan:   Informed Consent: I have reviewed the patients History and Physical, chart, labs and discussed the procedure including the risks, benefits and alternatives for the proposed anesthesia with the patient or authorized representative who has indicated his/her understanding and acceptance.     Dental advisory given  Plan Discussed with: CRNA  Anesthesia Plan Comments:          Anesthesia Quick Evaluation

## 2024-06-18 NOTE — Discharge Instructions (Signed)

## 2024-06-18 NOTE — Transfer of Care (Signed)
 Immediate Anesthesia Transfer of Care Note  Patient: Sara Evans  Procedure(s) Performed: Procedure(s) with comments: COLONOSCOPY (N/A) - 05/14/24  Patient Location: Endoscopy Unit  Anesthesia Type:MAC  Level of Consciousness:  sedated, patient cooperative and responds to stimulation  Airway & Oxygen Therapy:Patient Spontanous Breathing and Patient connected to face mask oxgen  Post-op Assessment:  Report given to PACU RN and Post -op Vital signs reviewed and stable  Post vital signs:  Reviewed and stable  Last Vitals:  Vitals:   06/18/24 0835  BP: (!) 163/66  Resp: 18  Temp: 36.7 C  SpO2: 96%    Complications: No apparent anesthesia complications

## 2024-06-19 ENCOUNTER — Encounter (HOSPITAL_COMMUNITY): Payer: Self-pay | Admitting: Internal Medicine

## 2024-06-27 DIAGNOSIS — M1990 Unspecified osteoarthritis, unspecified site: Secondary | ICD-10-CM | POA: Diagnosis not present

## 2024-06-27 DIAGNOSIS — I10 Essential (primary) hypertension: Secondary | ICD-10-CM | POA: Diagnosis not present

## 2024-06-27 DIAGNOSIS — E039 Hypothyroidism, unspecified: Secondary | ICD-10-CM | POA: Diagnosis not present

## 2024-06-27 DIAGNOSIS — E785 Hyperlipidemia, unspecified: Secondary | ICD-10-CM | POA: Diagnosis not present

## 2024-07-28 DIAGNOSIS — E039 Hypothyroidism, unspecified: Secondary | ICD-10-CM | POA: Diagnosis not present

## 2024-08-04 DIAGNOSIS — I10 Essential (primary) hypertension: Secondary | ICD-10-CM | POA: Diagnosis not present

## 2024-08-04 DIAGNOSIS — E876 Hypokalemia: Secondary | ICD-10-CM | POA: Diagnosis not present

## 2024-08-04 DIAGNOSIS — R296 Repeated falls: Secondary | ICD-10-CM | POA: Diagnosis not present

## 2024-08-04 DIAGNOSIS — M1990 Unspecified osteoarthritis, unspecified site: Secondary | ICD-10-CM | POA: Diagnosis not present

## 2024-08-04 DIAGNOSIS — E785 Hyperlipidemia, unspecified: Secondary | ICD-10-CM | POA: Diagnosis not present

## 2024-08-04 DIAGNOSIS — E039 Hypothyroidism, unspecified: Secondary | ICD-10-CM | POA: Diagnosis not present

## 2024-08-04 DIAGNOSIS — R2681 Unsteadiness on feet: Secondary | ICD-10-CM | POA: Diagnosis not present

## 2024-08-04 DIAGNOSIS — F419 Anxiety disorder, unspecified: Secondary | ICD-10-CM | POA: Diagnosis not present

## 2024-08-04 DIAGNOSIS — Z23 Encounter for immunization: Secondary | ICD-10-CM | POA: Diagnosis not present

## 2024-08-04 DIAGNOSIS — M810 Age-related osteoporosis without current pathological fracture: Secondary | ICD-10-CM | POA: Diagnosis not present

## 2024-08-04 DIAGNOSIS — R600 Localized edema: Secondary | ICD-10-CM | POA: Diagnosis not present

## 2024-08-14 ENCOUNTER — Ambulatory Visit
Admission: RE | Admit: 2024-08-14 | Discharge: 2024-08-14 | Disposition: A | Payer: Medicare HMO | Source: Ambulatory Visit | Attending: Internal Medicine | Admitting: Internal Medicine

## 2024-08-14 DIAGNOSIS — Z1231 Encounter for screening mammogram for malignant neoplasm of breast: Secondary | ICD-10-CM | POA: Diagnosis not present

## 2024-08-21 DIAGNOSIS — H04123 Dry eye syndrome of bilateral lacrimal glands: Secondary | ICD-10-CM | POA: Diagnosis not present

## 2024-08-21 DIAGNOSIS — H524 Presbyopia: Secondary | ICD-10-CM | POA: Diagnosis not present

## 2024-08-21 DIAGNOSIS — H43813 Vitreous degeneration, bilateral: Secondary | ICD-10-CM | POA: Diagnosis not present

## 2024-08-21 DIAGNOSIS — H52221 Regular astigmatism, right eye: Secondary | ICD-10-CM | POA: Diagnosis not present

## 2024-08-21 DIAGNOSIS — H40033 Anatomical narrow angle, bilateral: Secondary | ICD-10-CM | POA: Diagnosis not present

## 2024-08-21 DIAGNOSIS — H43393 Other vitreous opacities, bilateral: Secondary | ICD-10-CM | POA: Diagnosis not present

## 2024-08-21 DIAGNOSIS — H5213 Myopia, bilateral: Secondary | ICD-10-CM | POA: Diagnosis not present

## 2024-08-21 DIAGNOSIS — H25813 Combined forms of age-related cataract, bilateral: Secondary | ICD-10-CM | POA: Diagnosis not present

## 2024-08-21 DIAGNOSIS — H2513 Age-related nuclear cataract, bilateral: Secondary | ICD-10-CM | POA: Diagnosis not present

## 2024-09-11 ENCOUNTER — Encounter: Payer: Self-pay | Admitting: Podiatry

## 2024-09-11 ENCOUNTER — Ambulatory Visit: Admitting: Podiatry

## 2024-09-11 DIAGNOSIS — M722 Plantar fascial fibromatosis: Secondary | ICD-10-CM | POA: Diagnosis not present

## 2024-09-11 MED ORDER — TRIAMCINOLONE ACETONIDE 10 MG/ML IJ SUSP
10.0000 mg | Freq: Once | INTRAMUSCULAR | Status: AC
  Administered 2024-09-11: 10 mg via INTRA_ARTICULAR

## 2024-09-11 NOTE — Patient Instructions (Signed)
 Patient owes 74.00 for night splint 09/11/2024- paper signed

## 2024-09-13 NOTE — Progress Notes (Signed)
 Subjective:   Patient ID: Sara Evans, female   DOB: 68 y.o.   MRN: [id number]   HPI Patient states that she has developed increased pain in the right plantar heel and it is worse when she gets up in the morning and it is worse after periods of sitting and has trouble stretching properly   ROS      Objective:  Physical Exam  Neurovascular status intact muscle strength found to be adequate range of motion adequate with patient found to have reoccurrence of exquisite discomfort plantar aspect right heel at the insertional point tendon calcaneus and states its been present for several months     Assessment:  Acute plantar fasciitis right with chronic pain and inability to stretch properly     Plan:  H&P reviewed went ahead today did do sterile prep and reinjected the fascia at insertion 3 mg Kenalog 5 mg Xylocaine  and today I went ahead and I applied night splint properly fitting it into the arch and lower leg to properly stretch it to take the stress off the area.  Patient will be seen back to recheck

## 2024-10-07 DIAGNOSIS — L7211 Pilar cyst: Secondary | ICD-10-CM | POA: Diagnosis not present

## 2024-10-07 DIAGNOSIS — I8312 Varicose veins of left lower extremity with inflammation: Secondary | ICD-10-CM | POA: Diagnosis not present

## 2024-10-07 DIAGNOSIS — I8311 Varicose veins of right lower extremity with inflammation: Secondary | ICD-10-CM | POA: Diagnosis not present

## 2024-10-07 DIAGNOSIS — L821 Other seborrheic keratosis: Secondary | ICD-10-CM | POA: Diagnosis not present

## 2024-10-07 DIAGNOSIS — D2221 Melanocytic nevi of right ear and external auricular canal: Secondary | ICD-10-CM | POA: Diagnosis not present

## 2024-10-07 DIAGNOSIS — Z85828 Personal history of other malignant neoplasm of skin: Secondary | ICD-10-CM | POA: Diagnosis not present

## 2024-10-07 DIAGNOSIS — I872 Venous insufficiency (chronic) (peripheral): Secondary | ICD-10-CM | POA: Diagnosis not present

## 2024-10-07 DIAGNOSIS — D1801 Hemangioma of skin and subcutaneous tissue: Secondary | ICD-10-CM | POA: Diagnosis not present

## 2024-10-23 ENCOUNTER — Encounter: Payer: Self-pay | Admitting: Podiatry

## 2024-10-23 ENCOUNTER — Ambulatory Visit: Admitting: Podiatry

## 2024-10-23 DIAGNOSIS — M7661 Achilles tendinitis, right leg: Secondary | ICD-10-CM

## 2024-10-23 DIAGNOSIS — Q828 Other specified congenital malformations of skin: Secondary | ICD-10-CM | POA: Diagnosis not present

## 2024-10-23 DIAGNOSIS — M722 Plantar fascial fibromatosis: Secondary | ICD-10-CM | POA: Diagnosis not present

## 2024-10-23 NOTE — Progress Notes (Signed)
 Subjective:   Patient ID: Sara Evans, female   DOB: 68 y.o.   MRN: [id number]   HPI Patient states that she is improving still having discomfort in the plantar heel right but somewhat better with lesions that are also bothersome for her.  Patient is using night splint   ROS      Objective:  Physical Exam  Neurovascular status intact several problems noted with lucent cord lesion inflammation of a mild nature plantar heel but improved     Assessment:  Inflammatory fascial inflammation gradual improvement with porokeratotic lesion formation moderate pain     Plan:  H&P education on both conditions continuation of night splint ice stretch and for the porokeratosis I did do courtesy debridement and instructed on ways to try to control it herself.  Patient will be seen back to recheck all questions answered
